# Patient Record
Sex: Female | Born: 1950 | Race: White | Hispanic: No | Marital: Married | Smoking: Former smoker
Health system: Southern US, Community
[De-identification: ages and names within clinical notes are randomized; demographics above are authoritative.]

## PROBLEM LIST (undated history)

## (undated) DIAGNOSIS — R05 Cough: Secondary | ICD-10-CM

## (undated) DIAGNOSIS — R112 Nausea with vomiting, unspecified: Secondary | ICD-10-CM

## (undated) DIAGNOSIS — M1990 Unspecified osteoarthritis, unspecified site: Secondary | ICD-10-CM

## (undated) DIAGNOSIS — D649 Anemia, unspecified: Secondary | ICD-10-CM

## (undated) DIAGNOSIS — J45909 Unspecified asthma, uncomplicated: Secondary | ICD-10-CM

## (undated) DIAGNOSIS — J302 Other seasonal allergic rhinitis: Secondary | ICD-10-CM

## (undated) DIAGNOSIS — K219 Gastro-esophageal reflux disease without esophagitis: Secondary | ICD-10-CM

## (undated) DIAGNOSIS — R600 Localized edema: Secondary | ICD-10-CM

## (undated) DIAGNOSIS — Z9889 Other specified postprocedural states: Secondary | ICD-10-CM

## (undated) HISTORY — PX: TONSILLECTOMY: SUR1361

## (undated) HISTORY — PX: SHOULDER ARTHROSCOPY WITH ROTATOR CUFF REPAIR: SHX5685

## (undated) HISTORY — PX: KNEE ARTHROSCOPY: SUR90

## (undated) HISTORY — PX: APPENDECTOMY: SHX54

## (undated) HISTORY — PX: BREAST SURGERY: SHX581

---

## 1998-05-03 ENCOUNTER — Other Ambulatory Visit: Admission: RE | Admit: 1998-05-03 | Discharge: 1998-05-03 | Payer: Self-pay | Admitting: Obstetrics and Gynecology

## 1999-03-03 ENCOUNTER — Other Ambulatory Visit: Admission: RE | Admit: 1999-03-03 | Discharge: 1999-03-03 | Payer: Self-pay | Admitting: Obstetrics and Gynecology

## 1999-10-18 ENCOUNTER — Other Ambulatory Visit: Admission: RE | Admit: 1999-10-18 | Discharge: 1999-10-18 | Payer: Self-pay | Admitting: Obstetrics and Gynecology

## 1999-11-22 ENCOUNTER — Encounter: Payer: Self-pay | Admitting: Obstetrics and Gynecology

## 1999-11-22 ENCOUNTER — Encounter: Admission: RE | Admit: 1999-11-22 | Discharge: 1999-11-22 | Payer: Self-pay | Admitting: Obstetrics and Gynecology

## 2000-11-28 ENCOUNTER — Other Ambulatory Visit: Admission: RE | Admit: 2000-11-28 | Discharge: 2000-11-28 | Payer: Self-pay | Admitting: Obstetrics and Gynecology

## 2001-01-08 ENCOUNTER — Encounter: Payer: Self-pay | Admitting: Obstetrics and Gynecology

## 2001-01-08 ENCOUNTER — Encounter: Admission: RE | Admit: 2001-01-08 | Discharge: 2001-01-08 | Payer: Self-pay | Admitting: Obstetrics and Gynecology

## 2001-09-13 ENCOUNTER — Encounter: Admission: RE | Admit: 2001-09-13 | Discharge: 2001-09-13 | Payer: Self-pay | Admitting: Obstetrics and Gynecology

## 2001-09-13 ENCOUNTER — Encounter: Payer: Self-pay | Admitting: Obstetrics and Gynecology

## 2002-01-07 ENCOUNTER — Other Ambulatory Visit: Admission: RE | Admit: 2002-01-07 | Discharge: 2002-01-07 | Payer: Self-pay | Admitting: Obstetrics and Gynecology

## 2002-03-14 ENCOUNTER — Encounter: Payer: Self-pay | Admitting: Obstetrics and Gynecology

## 2002-03-14 ENCOUNTER — Encounter: Admission: RE | Admit: 2002-03-14 | Discharge: 2002-03-14 | Payer: Self-pay | Admitting: Obstetrics and Gynecology

## 2002-07-15 ENCOUNTER — Ambulatory Visit (HOSPITAL_COMMUNITY): Admission: RE | Admit: 2002-07-15 | Discharge: 2002-07-15 | Payer: Self-pay | Admitting: Family Medicine

## 2002-07-15 ENCOUNTER — Encounter: Payer: Self-pay | Admitting: Family Medicine

## 2003-04-15 ENCOUNTER — Encounter: Payer: Self-pay | Admitting: Obstetrics and Gynecology

## 2003-04-15 ENCOUNTER — Encounter: Admission: RE | Admit: 2003-04-15 | Discharge: 2003-04-15 | Payer: Self-pay | Admitting: Obstetrics and Gynecology

## 2003-07-22 ENCOUNTER — Other Ambulatory Visit: Admission: RE | Admit: 2003-07-22 | Discharge: 2003-07-22 | Payer: Self-pay | Admitting: Obstetrics and Gynecology

## 2003-12-15 ENCOUNTER — Encounter: Admission: RE | Admit: 2003-12-15 | Discharge: 2003-12-15 | Payer: Self-pay | Admitting: Family Medicine

## 2004-05-05 ENCOUNTER — Ambulatory Visit (HOSPITAL_COMMUNITY): Admission: RE | Admit: 2004-05-05 | Discharge: 2004-05-05 | Payer: Self-pay | Admitting: Family Medicine

## 2004-06-23 ENCOUNTER — Ambulatory Visit (HOSPITAL_COMMUNITY): Admission: RE | Admit: 2004-06-23 | Discharge: 2004-06-23 | Payer: Self-pay | Admitting: Obstetrics and Gynecology

## 2004-09-28 ENCOUNTER — Other Ambulatory Visit: Admission: RE | Admit: 2004-09-28 | Discharge: 2004-09-28 | Payer: Self-pay | Admitting: Obstetrics and Gynecology

## 2005-08-16 ENCOUNTER — Ambulatory Visit (HOSPITAL_COMMUNITY): Admission: RE | Admit: 2005-08-16 | Discharge: 2005-08-16 | Payer: Self-pay | Admitting: Obstetrics and Gynecology

## 2005-11-10 ENCOUNTER — Other Ambulatory Visit: Admission: RE | Admit: 2005-11-10 | Discharge: 2005-11-10 | Payer: Self-pay | Admitting: Obstetrics and Gynecology

## 2006-10-02 ENCOUNTER — Ambulatory Visit (HOSPITAL_COMMUNITY): Admission: RE | Admit: 2006-10-02 | Discharge: 2006-10-02 | Payer: Self-pay | Admitting: Obstetrics and Gynecology

## 2007-11-20 ENCOUNTER — Ambulatory Visit (HOSPITAL_COMMUNITY): Admission: RE | Admit: 2007-11-20 | Discharge: 2007-11-20 | Payer: Self-pay | Admitting: Obstetrics and Gynecology

## 2008-12-25 ENCOUNTER — Ambulatory Visit (HOSPITAL_COMMUNITY): Admission: RE | Admit: 2008-12-25 | Discharge: 2008-12-25 | Payer: Self-pay | Admitting: Obstetrics and Gynecology

## 2010-02-24 ENCOUNTER — Ambulatory Visit (HOSPITAL_COMMUNITY): Admission: RE | Admit: 2010-02-24 | Discharge: 2010-02-24 | Payer: Self-pay | Admitting: Obstetrics and Gynecology

## 2011-03-21 ENCOUNTER — Other Ambulatory Visit (HOSPITAL_COMMUNITY): Payer: Self-pay | Admitting: Obstetrics and Gynecology

## 2011-03-21 DIAGNOSIS — Z1231 Encounter for screening mammogram for malignant neoplasm of breast: Secondary | ICD-10-CM

## 2011-03-29 ENCOUNTER — Ambulatory Visit (HOSPITAL_COMMUNITY)
Admission: RE | Admit: 2011-03-29 | Discharge: 2011-03-29 | Disposition: A | Discharge status: Home / Self Care | Payer: 59 | Source: Ambulatory Visit | Attending: Obstetrics and Gynecology | Admitting: Obstetrics and Gynecology

## 2011-03-29 DIAGNOSIS — Z1231 Encounter for screening mammogram for malignant neoplasm of breast: Secondary | ICD-10-CM

## 2012-04-23 ENCOUNTER — Other Ambulatory Visit (HOSPITAL_COMMUNITY): Payer: Self-pay | Admitting: Obstetrics and Gynecology

## 2012-04-23 DIAGNOSIS — Z1231 Encounter for screening mammogram for malignant neoplasm of breast: Secondary | ICD-10-CM

## 2012-06-03 ENCOUNTER — Ambulatory Visit (HOSPITAL_COMMUNITY)
Admission: RE | Admit: 2012-06-03 | Discharge: 2012-06-03 | Disposition: A | Discharge status: Home / Self Care | Payer: 59 | Source: Ambulatory Visit | Attending: Obstetrics and Gynecology | Admitting: Obstetrics and Gynecology

## 2012-06-03 DIAGNOSIS — Z1231 Encounter for screening mammogram for malignant neoplasm of breast: Secondary | ICD-10-CM | POA: Insufficient documentation

## 2013-07-29 ENCOUNTER — Other Ambulatory Visit (HOSPITAL_COMMUNITY): Payer: Self-pay | Admitting: Obstetrics and Gynecology

## 2013-07-29 DIAGNOSIS — Z1231 Encounter for screening mammogram for malignant neoplasm of breast: Secondary | ICD-10-CM

## 2013-08-11 DIAGNOSIS — J302 Other seasonal allergic rhinitis: Secondary | ICD-10-CM

## 2013-08-11 HISTORY — DX: Other seasonal allergic rhinitis: J30.2

## 2013-08-13 ENCOUNTER — Ambulatory Visit (HOSPITAL_COMMUNITY): Payer: 59

## 2013-08-21 ENCOUNTER — Other Ambulatory Visit: Payer: Self-pay | Admitting: Orthopedic Surgery

## 2013-08-26 ENCOUNTER — Encounter (HOSPITAL_COMMUNITY): Payer: Self-pay | Admitting: Pharmacy Technician

## 2013-08-26 ENCOUNTER — Other Ambulatory Visit: Payer: Self-pay | Admitting: Orthopedic Surgery

## 2013-08-28 ENCOUNTER — Other Ambulatory Visit (HOSPITAL_COMMUNITY): Payer: Self-pay | Admitting: Orthopedic Surgery

## 2013-08-28 NOTE — Patient Instructions (Addendum)
20 SHANTAY SONN  08/28/2013   Your procedure is scheduled on:  09/08/13  MONDAY  Report to [facility] at  0730     AM.  Call this number if you have problems the morning of surgery: [phone]       Remember:   Do not eat food  Or drink :After Midnight. Sunday NIGHT   Take these medicines the morning of surgery with A SIP OF WATER: Dexiliant        May take Alprazolam, or Allegra  if needed   .  Contacts, dentures or partial plates can not be worn to surgery  Leave suitcase in the car. After surgery it may be brought to your room.  For patients admitted to the hospital, checkout time is 11:00 AM day of  discharge.             SPECIAL INSTRUCTIONS- SEE [facility] PREPARING FOR SURGERY INSTRUCTION SHEET-     DO NOT WEAR JEWELRY, LOTIONS, POWDERS, OR PERFUMES.  WOMEN-- DO NOT SHAVE LEGS OR UNDERARMS FOR 12 HOURS BEFORE SHOWERS. MEN MAY SHAVE FACE.  Patients discharged the day of surgery will not be allowed to drive home. IF going home the day of surgery, you must have a driver and someone to stay with you for the first 24 hours  Name and phone number of your driver:  ADMISSION                                                                      Please read over the following fact sheets that you were given: MRSA Information, Incentive Spirometry Sheet, Blood Transfusion Sheet  Information                                                                                   Aireonna Bauer  PST 336  [phone]                 FAILURE TO FOLLOW THESE INSTRUCTIONS MAY RESULT IN  CANCELLATION   OF YOUR SURGERY                                                  Patient Signature _____________________________

## 2013-08-29 ENCOUNTER — Ambulatory Visit (HOSPITAL_COMMUNITY)
Admission: RE | Admit: 2013-08-29 | Discharge: 2013-08-29 | Disposition: A | Discharge status: Home / Self Care | Payer: 59 | Source: Ambulatory Visit | Attending: Orthopedic Surgery | Admitting: Orthopedic Surgery

## 2013-08-29 ENCOUNTER — Encounter (HOSPITAL_COMMUNITY)
Admission: RE | Admit: 2013-08-29 | Discharge: 2013-08-29 | Disposition: A | Discharge status: Home / Self Care | Payer: 59 | Source: Ambulatory Visit | Attending: Orthopedic Surgery | Admitting: Orthopedic Surgery

## 2013-08-29 ENCOUNTER — Encounter (HOSPITAL_COMMUNITY): Payer: Self-pay

## 2013-08-29 DIAGNOSIS — Z01812 Encounter for preprocedural laboratory examination: Secondary | ICD-10-CM | POA: Insufficient documentation

## 2013-08-29 DIAGNOSIS — M1990 Unspecified osteoarthritis, unspecified site: Secondary | ICD-10-CM | POA: Insufficient documentation

## 2013-08-29 DIAGNOSIS — R05 Cough: Secondary | ICD-10-CM | POA: Insufficient documentation

## 2013-08-29 DIAGNOSIS — Z01818 Encounter for other preprocedural examination: Secondary | ICD-10-CM | POA: Insufficient documentation

## 2013-08-29 DIAGNOSIS — R059 Cough, unspecified: Secondary | ICD-10-CM | POA: Insufficient documentation

## 2013-08-29 HISTORY — DX: Unspecified osteoarthritis, unspecified site: M19.90

## 2013-08-29 HISTORY — DX: Localized edema: R60.0

## 2013-08-29 HISTORY — DX: Cough: R05

## 2013-08-29 HISTORY — DX: Unspecified asthma, uncomplicated: J45.909

## 2013-08-29 HISTORY — DX: Other specified postprocedural states: Z98.890

## 2013-08-29 HISTORY — DX: Nausea with vomiting, unspecified: R11.2

## 2013-08-29 HISTORY — DX: Gastro-esophageal reflux disease without esophagitis: K21.9

## 2013-08-29 HISTORY — DX: Other seasonal allergic rhinitis: J30.2

## 2013-08-29 HISTORY — DX: Anemia, unspecified: D64.9

## 2013-08-29 HISTORY — DX: Cough, unspecified: R05.9

## 2013-08-29 LAB — COMPREHENSIVE METABOLIC PANEL
ALT: 27 U/L (ref 0–35)
AST: 27 U/L (ref 0–37)
Albumin: 3.8 g/dL (ref 3.5–5.2)
CO2: 26 mEq/L (ref 19–32)
Chloride: 98 mEq/L (ref 96–112)
GFR calc non Af Amer: >90 mL/min (ref >90)
Potassium: 4 mEq/L (ref 3.5–5.1)
Sodium: 136 mEq/L (ref 135–145)
Total Bilirubin: 0.5 mg/dL (ref 0.3–1.2)

## 2013-08-29 LAB — URINALYSIS, ROUTINE W REFLEX MICROSCOPIC
Bilirubin Urine: NEGATIVE
Glucose, UA: NEGATIVE mg/dL
Ketones, ur: NEGATIVE mg/dL
Leukocytes, UA: NEGATIVE
Specific Gravity, Urine: 1.008 (ref 1.005–1.030)
pH: 7.5 (ref 5.0–8.0)

## 2013-08-29 LAB — CBC
Platelets: 211 10*3/uL (ref 150–400)
RBC: 4.51 MIL/uL (ref 3.87–5.11)
WBC: 8.1 10*3/uL (ref 4.0–10.5)

## 2013-08-29 LAB — APTT: aPTT: 26 seconds (ref 24–37)

## 2013-08-29 LAB — SURGICAL PCR SCREEN: Staphylococcus aureus: NEGATIVE

## 2013-09-07 ENCOUNTER — Other Ambulatory Visit: Payer: Self-pay | Admitting: Orthopedic Surgery

## 2013-09-07 NOTE — H&P (Signed)
Christina Baker. Christina Baker  DOB: [birth year] Married / Language: English / Race: White Female  Date of Admission:  09/08/2013  Chief Complaint:  Right Knee Pain  History of Present Illness The patient is a 62 year old female who comes in for a preoperative History and Physical. The patient is scheduled for a right total knee arthroplasty to be performed by Dr. Gus Rankin. Aluisio, MD at [facility] on 09/08/2013. The patient is a 62 year old female who presents for follow up of their knee. The patient is being followed for their right knee pain and osteoarthritis. They are now 10 week(s) out from the last cortisone injection. Symptoms reported today include: pain and grinding. The patient feels that they are doing poorly (Patient states that she has continued to have pain after the injection. She would like to know if there is any other option besides a replacement.). Note for "Follow-up Knee": French Ana did her injection when she was seeing Dr. Rennis Chris for her shoulder. She states that the right knee is getting progressively worse over time. This hurts with most activities including some pain at rest now. This is definitely limiting what she can and can not do. The last cortisone injection provided some short term benefit but the knee is getting worse now. She is not having any hip pain or back pain with this. She is ready to proceed with knee surgery at this time. They have been treated conservatively in the past for the above stated problem and despite conservative measures, they continue to have progressive pain and severe functional limitations and dysfunction. They have failed non-operative management including home exercise, medications, and injections. It is felt that they would benefit from undergoing total joint replacement. Risks and benefits of the procedure have been discussed with the patient and they elect to proceed with surgery. There are no active contraindications to surgery such  as ongoing infection or rapidly progressive neurological disease.  Problem List Osteoarthritis, Knee (715.96)  Allergies Daypro *ANALGESICS - ANTI-INFLAMMATORY*. Trouble Breathing Dilaudid *ANALGESICS - OPIOID*. Hives.   Family History Congestive Heart Failure. father Osteoarthritis. mother Hypertension. mother, father and sister Heart disease in female family member before age 55 Cerebrovascular Accident. mother and grandmother mothers side Cancer. father and grandmother fathers side Heart Disease. father Depression. sister   Social History Pain Contract. yes Tobacco / smoke exposure. no Current work status. retired Exercise. Exercises daily; does running / walking Copy of Drug/Alcohol Rehab (Previously). no Living situation. live with spouse Drug/Alcohol Rehab (Currently). no Number of flights of stairs before winded. greater than 5 Alcohol use. current drinker; drinks wine; less than 5 per week Illicit drug use. no Children. 2 Marital status. married Tobacco use. former smoker; smoke(d) less than 1/2 pack(s) per day Post-Surgical Plans. Home   Medication History Maxzide-25 (37.5-25MG  Tablet, Oral) Active. Dexilant (30MG  Capsule DR, Oral) Active. Vitamin D (2000UNIT Capsule, 1 (one) Oral) Active. Advil (200MG  Capsule, Oral) Active.   Past Surgical History Appendectomy Rotator Cuff Repair. right Breast Biopsy. right Dilation and Curettage of Uterus Arthroscopy of Knee. right Arthroscopy of Shoulder. right Tonsillectomy   Medical history Asthma Gastroesophageal Reflux Disease Hypercholesterolemia. 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 05/15/2003 Pain in joint, shoulder (719.41). 05/22/2003 Sprain/strain, neck (847.0). 08/14/2003 Sprain/strain, rotator cuff (840.4). 02/26/2004 Tear, medial meniscus, knee, current (836.0). 03/03/2004 Synovitis NEC (727.09).  03/27/2005 Sprain/strain, foot NOS (845.10). 08/09/2007 Sprain/strain, knee, cruciate ligament (844.2). 08/16/2007 Derangement, meniscus NEC (717.5). 09/30/2007 Cervicalgia (723.1). 03/19/2009 Osteoarthrosis NOS, ankle/foot (715.97). 03/27/2011  Review of Systems General:Present- Night Sweats (not new, chronic in nature). Not Present- Chills, Fever, Fatigue, Weight Gain, Weight Loss and Memory Loss. Skin:Not Present- Hives, Itching, Rash, Eczema and Lesions. HEENT:Not Present- Tinnitus, Headache, Double Vision, Visual Loss, Hearing Loss and Dentures. Respiratory:Present- Shortness of breath with exertion and Wheezing (recent cold). Not Present- Shortness of breath at rest, Allergies, Coughing up blood and Chronic Cough. Cardiovascular:Not Present- Chest Pain, Racing/skipping heartbeats, Difficulty Breathing Lying Down, Murmur, Swelling and Palpitations. Gastrointestinal:Present- Heartburn. Not Present- Bloody Stool, Abdominal Pain, Vomiting, Nausea, Constipation, Diarrhea, Difficulty Swallowing, Jaundice and Loss of appetitie. Female Genitourinary:Not Present- Blood in Urine, Urinary frequency, Weak urinary stream, Discharge, Flank Pain, Incontinence, Painful Urination, Urgency, Urinary Retention and Urinating at Night. Musculoskeletal:Present- Muscle Pain, Joint Pain, Back Pain and Morning Stiffness. Not Present- Muscle Weakness, Joint Swelling and Spasms. Neurological:Not Present- Tremor, Dizziness, Blackout spells, Paralysis, Difficulty with balance and Weakness. Psychiatric:Not Present- Insomnia.   Vitals Weight: 185 lb Height: 61 in Weight was reported by patient. Height was reported by patient. Body Surface Area: 1.9 m Body Mass Index: 34.96 kg/m Pulse: 76 (Regular) Resp.: 12 (Unlabored) BP: 124/70 (Sitting, Right Arm, Standard)   Physical Exam The physical exam findings are as follows:   General Mental Status - Alert, cooperative and good  historian. General Appearance- pleasant. Not in acute distress. Orientation- Oriented X3. Build & Nutrition- Well nourished and Well developed.   Head and Neck Head- normocephalic, atraumatic . Neck Global Assessment- supple. no bruit auscultated on the right and no bruit auscultated on the left.   Eye Vision- Wears corrective lenses. Pupil- Bilateral- Regular and Round. Motion- Bilateral- EOMI.   Chest and Lung Exam Auscultation: Breath sounds:- clear at anterior chest wall and - clear at posterior chest wall. Adventitious sounds:- No Adventitious sounds.   Cardiovascular Auscultation:Rhythm- Regular rate and rhythm. Heart Sounds- S1 WNL and S2 WNL. Murmurs & Other Heart Sounds:Auscultation of the heart reveals - No Murmurs.   Abdomen Inspection:Contour- Generalized mild distention. Palpation/Percussion:Tenderness- Abdomen is non-tender to palpation. Rigidity (guarding)- Abdomen is soft. Auscultation:Auscultation of the abdomen reveals - Bowel sounds normal.   Female Genitourinary Not done, not pertinent to present illness  Musculoskeletal She is alert and oriented. No apparent distress. Her hips show a normal range of motion. No discomfort. The left knee shows no effusion. Range is about 0-135 with a slight crepitus on range of motion. No medial or lateral joint line tenderness. No instability. Her right knee shows no effusion. Range is about 5-125. There is moderate crepitus on range of motion. She is tender in the medial joint line. No lateral tenderness or instability noted. Pulse, sensation and motor are intact. Gait pattern is antalgic on the right.  RADIOGRAPHS: AP of both knees and lateral of the right from last fall show she does have bone on bone arthritis at the joint margin on the medial side of the joint. She also has significant patellofemoral arthritis.  Assessment & Plan Primary osteoarthritis of one knee (715.16) Impression:  Right Knee  Note: Plan is for a Right Total Knee Replacement by Dr. Lequita Halt.  Plan is to go home.  PCP - Dr. Durwin Nora - Patient has been seen preoperatively and felt to be stable for surgery.  Please note that the patinet states that she get sick and nauseated with anesthsia.  The patient does not have any contraindications and will receive TXA (tranexamic acid) prior to surgery.  Signed electronically by Lauraine Rinne, III PA-C

## 2013-09-08 ENCOUNTER — Encounter (HOSPITAL_COMMUNITY)
Admission: RE | Disposition: A | Discharge status: Home Health | Payer: Self-pay | Source: Ambulatory Visit | Attending: Orthopedic Surgery

## 2013-09-08 ENCOUNTER — Inpatient Hospital Stay (HOSPITAL_COMMUNITY)
Admission: RE | Admit: 2013-09-08 | Discharge: 2013-09-10 | DRG: 470 | Disposition: A | Discharge status: Home Health | Payer: 59 | Source: Ambulatory Visit | Attending: Orthopedic Surgery | Admitting: Orthopedic Surgery

## 2013-09-08 ENCOUNTER — Encounter (HOSPITAL_COMMUNITY): Payer: Self-pay | Admitting: *Deleted

## 2013-09-08 ENCOUNTER — Inpatient Hospital Stay (HOSPITAL_COMMUNITY): Payer: 59 | Admitting: Anesthesiology

## 2013-09-08 ENCOUNTER — Encounter (HOSPITAL_COMMUNITY): Payer: Self-pay | Admitting: Anesthesiology

## 2013-09-08 DIAGNOSIS — Z87891 Personal history of nicotine dependence: Secondary | ICD-10-CM

## 2013-09-08 DIAGNOSIS — Z6835 Body mass index (BMI) 35.0-35.9, adult: Secondary | ICD-10-CM

## 2013-09-08 DIAGNOSIS — Z96651 Presence of right artificial knee joint: Secondary | ICD-10-CM

## 2013-09-08 DIAGNOSIS — E876 Hypokalemia: Secondary | ICD-10-CM | POA: Diagnosis not present

## 2013-09-08 DIAGNOSIS — M1710 Unilateral primary osteoarthritis, unspecified knee: Principal | ICD-10-CM | POA: Diagnosis present

## 2013-09-08 DIAGNOSIS — D62 Acute posthemorrhagic anemia: Secondary | ICD-10-CM | POA: Diagnosis not present

## 2013-09-08 HISTORY — PX: TOTAL KNEE ARTHROPLASTY: SHX125

## 2013-09-08 LAB — TYPE AND SCREEN
ABO/RH(D): B NEG
Antibody Screen: NEGATIVE

## 2013-09-08 SURGERY — ARTHROPLASTY, KNEE, TOTAL
Anesthesia: General | Site: Knee | Laterality: Right | Wound class: Clean

## 2013-09-08 MED ORDER — DEXAMETHASONE 6 MG PO TABS
10.0000 mg | ORAL_TABLET | Freq: Every day | ORAL | Status: AC
  Administered 2013-09-09: 10 mg via ORAL
  Filled 2013-09-08: qty 1

## 2013-09-08 MED ORDER — METOCLOPRAMIDE HCL 10 MG PO TABS: 5.0000 mg | ORAL_TABLET | Freq: Three times a day (TID) | ORAL | Status: DC | PRN

## 2013-09-08 MED ORDER — MORPHINE SULFATE 2 MG/ML IJ SOLN
INTRAMUSCULAR | Status: AC
  Administered 2013-09-08: 14:00:00 2 mg via INTRAVENOUS
  Filled 2013-09-08: qty 1

## 2013-09-08 MED ORDER — FENTANYL CITRATE 0.05 MG/ML IJ SOLN
INTRAMUSCULAR | Status: DC | PRN
  Administered 2013-09-08 (×3): 100 ug via INTRAVENOUS
  Administered 2013-09-08 (×3): 50 ug via INTRAVENOUS

## 2013-09-08 MED ORDER — 0.9 % SODIUM CHLORIDE (POUR BTL) OPTIME
TOPICAL | Status: DC | PRN
  Administered 2013-09-08: 1000 mL

## 2013-09-08 MED ORDER — MORPHINE SULFATE 2 MG/ML IJ SOLN: 1.0000 mg | INTRAMUSCULAR | Status: DC | PRN

## 2013-09-08 MED ORDER — CEFAZOLIN SODIUM 1-5 GM-% IV SOLN
1.0000 g | Freq: Four times a day (QID) | INTRAVENOUS | Status: AC
  Administered 2013-09-08 (×2): 1 g via INTRAVENOUS
  Filled 2013-09-08 (×2): qty 50

## 2013-09-08 MED ORDER — LACTATED RINGERS IV SOLN
INTRAVENOUS | Status: DC
  Administered 2013-09-08: 1000 mL via INTRAVENOUS

## 2013-09-08 MED ORDER — BISACODYL 10 MG RE SUPP: 10.0000 mg | Freq: Every day | RECTAL | Status: DC | PRN

## 2013-09-08 MED ORDER — SODIUM CHLORIDE 0.9 % IV SOLN: INTRAVENOUS | Status: DC

## 2013-09-08 MED ORDER — ATROPINE SULFATE 0.4 MG/ML IJ SOLN
INTRAMUSCULAR | Status: DC | PRN
  Administered 2013-09-08: 0.4 mg via INTRAVENOUS

## 2013-09-08 MED ORDER — DOCUSATE SODIUM 100 MG PO CAPS
100.0000 mg | ORAL_CAPSULE | Freq: Two times a day (BID) | ORAL | Status: DC
  Administered 2013-09-08 – 2013-09-10 (×4): 100 mg via ORAL

## 2013-09-08 MED ORDER — CEFAZOLIN SODIUM-DEXTROSE 2-3 GM-% IV SOLR
INTRAVENOUS | Status: AC
  Filled 2013-09-08: qty 50

## 2013-09-08 MED ORDER — LORATADINE 10 MG PO TABS
10.0000 mg | ORAL_TABLET | Freq: Every day | ORAL | Status: DC
  Administered 2013-09-09 – 2013-09-10 (×2): 10 mg via ORAL
  Filled 2013-09-08 (×2): qty 1

## 2013-09-08 MED ORDER — DEXAMETHASONE SODIUM PHOSPHATE 10 MG/ML IJ SOLN
10.0000 mg | Freq: Every day | INTRAMUSCULAR | Status: AC
  Filled 2013-09-08: qty 1

## 2013-09-08 MED ORDER — RIVAROXABAN 10 MG PO TABS
10.0000 mg | ORAL_TABLET | Freq: Every day | ORAL | Status: DC
  Administered 2013-09-09 – 2013-09-10 (×2): 10 mg via ORAL
  Filled 2013-09-08 (×3): qty 1

## 2013-09-08 MED ORDER — MENTHOL 3 MG MT LOZG
1.0000 | LOZENGE | OROMUCOSAL | Status: DC | PRN
  Filled 2013-09-08: qty 9

## 2013-09-08 MED ORDER — PROPOFOL 10 MG/ML IV BOLUS
INTRAVENOUS | Status: DC | PRN
  Administered 2013-09-08: 150 mg via INTRAVENOUS

## 2013-09-08 MED ORDER — MIDAZOLAM HCL 5 MG/5ML IJ SOLN
INTRAMUSCULAR | Status: DC | PRN
  Administered 2013-09-08: 2 mg via INTRAVENOUS

## 2013-09-08 MED ORDER — LIDOCAINE HCL (CARDIAC) 20 MG/ML IV SOLN
INTRAVENOUS | Status: DC | PRN
  Administered 2013-09-08: 80 mg via INTRAVENOUS

## 2013-09-08 MED ORDER — ONDANSETRON HCL 4 MG PO TABS: 4.0000 mg | ORAL_TABLET | Freq: Four times a day (QID) | ORAL | Status: DC | PRN

## 2013-09-08 MED ORDER — ONDANSETRON HCL 4 MG/2ML IJ SOLN: 4.0000 mg | Freq: Four times a day (QID) | INTRAMUSCULAR | Status: DC | PRN

## 2013-09-08 MED ORDER — MORPHINE SULFATE 10 MG/ML IJ SOLN: 2.0000 mg | INTRAMUSCULAR | Status: DC | PRN

## 2013-09-08 MED ORDER — MORPHINE SULFATE 10 MG/ML IJ SOLN
1.0000 mg | INTRAMUSCULAR | Status: DC | PRN
  Administered 2013-09-08 (×5): 2 mg via INTRAVENOUS

## 2013-09-08 MED ORDER — LACTATED RINGERS IV SOLN
INTRAVENOUS | Status: DC | PRN
  Administered 2013-09-08 (×2): via INTRAVENOUS

## 2013-09-08 MED ORDER — MORPHINE SULFATE 10 MG/ML IJ SOLN
INTRAMUSCULAR | Status: AC
  Filled 2013-09-08: qty 1

## 2013-09-08 MED ORDER — TRAMADOL HCL 50 MG PO TABS
50.0000 mg | ORAL_TABLET | Freq: Four times a day (QID) | ORAL | Status: DC | PRN
  Administered 2013-09-09: 100 mg via ORAL
  Filled 2013-09-08: qty 2

## 2013-09-08 MED ORDER — NEOSTIGMINE METHYLSULFATE 1 MG/ML IJ SOLN
INTRAMUSCULAR | Status: DC | PRN
  Administered 2013-09-08: 5 mg via INTRAVENOUS

## 2013-09-08 MED ORDER — METOCLOPRAMIDE HCL 5 MG/ML IJ SOLN: 5.0000 mg | Freq: Three times a day (TID) | INTRAMUSCULAR | Status: DC | PRN

## 2013-09-08 MED ORDER — SODIUM CHLORIDE 0.9 % IR SOLN
Status: DC | PRN
  Administered 2013-09-08: 500 mL

## 2013-09-08 MED ORDER — ONDANSETRON HCL 4 MG/2ML IJ SOLN
INTRAMUSCULAR | Status: DC | PRN
  Administered 2013-09-08: 4 mg via INTRAVENOUS

## 2013-09-08 MED ORDER — VITAMINS A & D EX OINT
TOPICAL_OINTMENT | CUTANEOUS | Status: AC
  Filled 2013-09-08: qty 5

## 2013-09-08 MED ORDER — KETAMINE HCL 50 MG/ML IJ SOLN
INTRAMUSCULAR | Status: DC | PRN
  Administered 2013-09-08 (×5): 10 mg via INTRAMUSCULAR

## 2013-09-08 MED ORDER — METHOCARBAMOL 500 MG PO TABS
500.0000 mg | ORAL_TABLET | Freq: Four times a day (QID) | ORAL | Status: DC | PRN
  Administered 2013-09-09 – 2013-09-10 (×3): 500 mg via ORAL
  Filled 2013-09-08 (×3): qty 1

## 2013-09-08 MED ORDER — ROCURONIUM BROMIDE 100 MG/10ML IV SOLN
INTRAVENOUS | Status: DC | PRN
  Administered 2013-09-08: 50 mg via INTRAVENOUS

## 2013-09-08 MED ORDER — GLYCOPYRROLATE 0.2 MG/ML IJ SOLN
INTRAMUSCULAR | Status: DC | PRN
  Administered 2013-09-08: .8 mg via INTRAVENOUS

## 2013-09-08 MED ORDER — ACETAMINOPHEN 500 MG PO TABS
1000.0000 mg | ORAL_TABLET | Freq: Four times a day (QID) | ORAL | Status: AC
  Administered 2013-09-08 – 2013-09-09 (×4): 1000 mg via ORAL
  Filled 2013-09-08 (×3): qty 2

## 2013-09-08 MED ORDER — DEXAMETHASONE SODIUM PHOSPHATE 10 MG/ML IJ SOLN
10.0000 mg | Freq: Once | INTRAMUSCULAR | Status: AC
  Administered 2013-09-08: 10 mg via INTRAVENOUS

## 2013-09-08 MED ORDER — METHOCARBAMOL 100 MG/ML IJ SOLN
500.0000 mg | Freq: Four times a day (QID) | INTRAVENOUS | Status: DC | PRN
  Administered 2013-09-08: 500 mg via INTRAVENOUS
  Filled 2013-09-08: qty 5

## 2013-09-08 MED ORDER — BUPIVACAINE HCL 0.25 % IJ SOLN
INTRAMUSCULAR | Status: DC | PRN
  Administered 2013-09-08: 20 mL

## 2013-09-08 MED ORDER — POLYETHYLENE GLYCOL 3350 17 G PO PACK: 17.0000 g | PACK | Freq: Every day | ORAL | Status: DC | PRN

## 2013-09-08 MED ORDER — CEFAZOLIN SODIUM-DEXTROSE 2-3 GM-% IV SOLR
2.0000 g | INTRAVENOUS | Status: AC
  Administered 2013-09-08: 2 g via INTRAVENOUS

## 2013-09-08 MED ORDER — SODIUM CHLORIDE 0.9 % IJ SOLN
INTRAMUSCULAR | Status: DC | PRN
  Administered 2013-09-08: 11:00:00

## 2013-09-08 MED ORDER — PHENOL 1.4 % MT LIQD
1.0000 | OROMUCOSAL | Status: DC | PRN
  Filled 2013-09-08: qty 177

## 2013-09-08 MED ORDER — DIPHENHYDRAMINE HCL 12.5 MG/5ML PO ELIX: 12.5000 mg | ORAL_SOLUTION | ORAL | Status: DC | PRN

## 2013-09-08 MED ORDER — BUPIVACAINE LIPOSOME 1.3 % IJ SUSP
20.0000 mL | Freq: Once | INTRAMUSCULAR | Status: DC
  Filled 2013-09-08: qty 20

## 2013-09-08 MED ORDER — OXYCODONE HCL 5 MG PO TABS
5.0000 mg | ORAL_TABLET | ORAL | Status: DC | PRN
  Administered 2013-09-08 – 2013-09-10 (×12): 10 mg via ORAL
  Filled 2013-09-08 (×12): qty 2

## 2013-09-08 MED ORDER — BUPIVACAINE HCL (PF) 0.25 % IJ SOLN
INTRAMUSCULAR | Status: AC
  Filled 2013-09-08: qty 30

## 2013-09-08 MED ORDER — TRANEXAMIC ACID 100 MG/ML IV SOLN
1000.0000 mg | INTRAVENOUS | Status: AC
  Administered 2013-09-08: 1000 mg via INTRAVENOUS
  Filled 2013-09-08: qty 10

## 2013-09-08 MED ORDER — FLEET ENEMA 7-19 GM/118ML RE ENEM: 1.0000 | ENEMA | Freq: Once | RECTAL | Status: AC | PRN

## 2013-09-08 MED ORDER — SODIUM CHLORIDE 0.9 % IJ SOLN
INTRAMUSCULAR | Status: AC
  Filled 2013-09-08: qty 50

## 2013-09-08 MED ORDER — TRIAMTERENE-HCTZ 37.5-25 MG PO TABS
1.0000 | ORAL_TABLET | Freq: Every day | ORAL | Status: DC
  Administered 2013-09-09 – 2013-09-10 (×2): 1 via ORAL
  Filled 2013-09-08 (×3): qty 1

## 2013-09-08 MED ORDER — KCL IN DEXTROSE-NACL 20-5-0.9 MEQ/L-%-% IV SOLN
INTRAVENOUS | Status: DC
  Administered 2013-09-08 – 2013-09-09 (×2): via INTRAVENOUS
  Filled 2013-09-08 (×2): qty 1000

## 2013-09-08 MED ORDER — ACETAMINOPHEN 500 MG PO TABS
1000.0000 mg | ORAL_TABLET | Freq: Once | ORAL | Status: AC
  Administered 2013-09-08: 1000 mg via ORAL
  Filled 2013-09-08: qty 2

## 2013-09-08 MED ORDER — LORAZEPAM 0.5 MG PO TABS: 0.5000 mg | ORAL_TABLET | Freq: Two times a day (BID) | ORAL | Status: DC | PRN

## 2013-09-08 MED ORDER — PANTOPRAZOLE SODIUM 40 MG PO TBEC
80.0000 mg | DELAYED_RELEASE_TABLET | Freq: Every day | ORAL | Status: DC
  Filled 2013-09-08: qty 2

## 2013-09-08 SURGICAL SUPPLY — 54 items
BAG ZIPLOCK 12X15 (MISCELLANEOUS) ×2 IMPLANT
BANDAGE ELASTIC 6 VELCRO ST LF (GAUZE/BANDAGES/DRESSINGS) ×2 IMPLANT
BANDAGE ESMARK 6X9 LF (GAUZE/BANDAGES/DRESSINGS) ×1 IMPLANT
BLADE SAG 18X100X1.27 (BLADE) ×2 IMPLANT
BLADE SAW SGTL 11.0X1.19X90.0M (BLADE) ×2 IMPLANT
BNDG ESMARK 6X9 LF (GAUZE/BANDAGES/DRESSINGS) ×2
BOWL SMART MIX CTS (DISPOSABLE) ×2 IMPLANT
CAPT RP KNEE ×2 IMPLANT
CEMENT HV SMART SET (Cement) ×2 IMPLANT
CLOTH BEACON ORANGE TIMEOUT ST (SAFETY) ×2 IMPLANT
CUFF TOURN SGL QUICK 34 (TOURNIQUET CUFF) ×1
CUFF TRNQT CYL 34X4X40X1 (TOURNIQUET CUFF) ×1 IMPLANT
DECANTER SPIKE VIAL GLASS SM (MISCELLANEOUS) ×2 IMPLANT
DRAPE EXTREMITY T 121X128X90 (DRAPE) ×2 IMPLANT
DRAPE POUCH INSTRU U-SHP 10X18 (DRAPES) ×2 IMPLANT
DRAPE U-SHAPE 47X51 STRL (DRAPES) ×2 IMPLANT
DRSG ADAPTIC 3X8 NADH LF (GAUZE/BANDAGES/DRESSINGS) ×2 IMPLANT
DRSG PAD ABDOMINAL 8X10 ST (GAUZE/BANDAGES/DRESSINGS) ×2 IMPLANT
DURAPREP 26ML APPLICATOR (WOUND CARE) ×2 IMPLANT
ELECT REM PT RETURN 9FT ADLT (ELECTROSURGICAL) ×2
ELECTRODE REM PT RTRN 9FT ADLT (ELECTROSURGICAL) ×1 IMPLANT
EVACUATOR 1/8 PVC DRAIN (DRAIN) ×2 IMPLANT
FACESHIELD LNG OPTICON STERILE (SAFETY) ×10 IMPLANT
GLOVE BIO SURGEON STRL SZ7.5 (GLOVE) ×2 IMPLANT
GLOVE BIO SURGEON STRL SZ8 (GLOVE) ×2 IMPLANT
GLOVE BIOGEL PI IND STRL 8 (GLOVE) ×2 IMPLANT
GLOVE BIOGEL PI INDICATOR 8 (GLOVE) ×2
GOWN PREVENTION PLUS LG XLONG (DISPOSABLE) ×2 IMPLANT
GOWN STRL REIN XL XLG (GOWN DISPOSABLE) ×4 IMPLANT
HANDPIECE INTERPULSE COAX TIP (DISPOSABLE) ×1
IMMOBILIZER KNEE 20 (SOFTGOODS) ×2
IMMOBILIZER KNEE 20 THIGH 36 (SOFTGOODS) ×1 IMPLANT
KIT BASIN OR (CUSTOM PROCEDURE TRAY) ×2 IMPLANT
MANIFOLD NEPTUNE II (INSTRUMENTS) ×2 IMPLANT
NDL SAFETY ECLIPSE 18X1.5 (NEEDLE) ×2 IMPLANT
NEEDLE HYPO 18GX1.5 SHARP (NEEDLE) ×2
NS IRRIG 1000ML POUR BTL (IV SOLUTION) ×2 IMPLANT
PACK TOTAL JOINT (CUSTOM PROCEDURE TRAY) ×2 IMPLANT
PADDING CAST COTTON 6X4 STRL (CAST SUPPLIES) ×2 IMPLANT
POSITIONER SURGICAL ARM (MISCELLANEOUS) ×2 IMPLANT
SET HNDPC FAN SPRY TIP SCT (DISPOSABLE) ×1 IMPLANT
SPONGE GAUZE 4X4 12PLY (GAUZE/BANDAGES/DRESSINGS) ×2 IMPLANT
STRIP CLOSURE SKIN 1/2X4 (GAUZE/BANDAGES/DRESSINGS) ×2 IMPLANT
SUCTION FRAZIER 12FR DISP (SUCTIONS) ×2 IMPLANT
SUT MNCRL AB 4-0 PS2 18 (SUTURE) ×2 IMPLANT
SUT VIC AB 2-0 CT1 27 (SUTURE) ×3
SUT VIC AB 2-0 CT1 TAPERPNT 27 (SUTURE) ×3 IMPLANT
SUT VLOC 180 0 24IN GS25 (SUTURE) ×2 IMPLANT
SYR 20CC LL (SYRINGE) ×2 IMPLANT
SYR 50ML LL SCALE MARK (SYRINGE) ×2 IMPLANT
TOWEL OR 17X26 10 PK STRL BLUE (TOWEL DISPOSABLE) ×4 IMPLANT
TRAY FOLEY CATH 14FRSI W/METER (CATHETERS) ×2 IMPLANT
WATER STERILE IRR 1500ML POUR (IV SOLUTION) ×4 IMPLANT
WRAP KNEE MAXI GEL POST OP (GAUZE/BANDAGES/DRESSINGS) ×2 IMPLANT

## 2013-09-08 NOTE — H&P (View-Only) (Signed)
Christina Baker  DOB: 09/23/1951 Married / Language: English / Race: White Female  Date of Admission:  09/08/2013  Chief Complaint:  Right Knee Pain  History of Present Illness The patient is a 62 year old female who comes in for a preoperative History and Physical. The patient is scheduled for a right total knee arthroplasty to be performed by Dr. Frank V. Aluisio, MD at [facility] on 09/08/2013. The patient is a 62 year old female who presents for follow up of their knee. The patient is being followed for their right knee pain and osteoarthritis. They are now 10 week(s) out from the last cortisone injection. Symptoms reported today include: pain and grinding. The patient feels that they are doing poorly (Patient states that she has continued to have pain after the injection. She would like to know if there is any other option besides a replacement.). Note for "Follow-up Knee": Tracy did her injection when she was seeing Dr. Supple for her shoulder. She states that the right knee is getting progressively worse over time. This hurts with most activities including some pain at rest now. This is definitely limiting what she can and can not do. The last cortisone injection provided some short term benefit but the knee is getting worse now. She is not having any hip pain or back pain with this. She is ready to proceed with knee surgery at this time. They have been treated conservatively in the past for the above stated problem and despite conservative measures, they continue to have progressive pain and severe functional limitations and dysfunction. They have failed non-operative management including home exercise, medications, and injections. It is felt that they would benefit from undergoing total joint replacement. Risks and benefits of the procedure have been discussed with the patient and they elect to proceed with surgery. There are no active contraindications to surgery such  as ongoing infection or rapidly progressive neurological disease.  Problem List Osteoarthritis, Knee (715.96)  Allergies Daypro *ANALGESICS - ANTI-INFLAMMATORY*. Trouble Breathing Dilaudid *ANALGESICS - OPIOID*. Hives.   Family History Congestive Heart Failure. father Osteoarthritis. mother Hypertension. mother, father and sister Heart disease in female family member before age 55 Cerebrovascular Accident. mother and grandmother mothers side Cancer. father and grandmother fathers side Heart Disease. father Depression. sister   Social History Pain Contract. yes Tobacco / smoke exposure. no Current work status. retired Exercise. Exercises daily; does running / walking Copy of Drug/Alcohol Rehab (Previously). no Living situation. live with spouse Drug/Alcohol Rehab (Currently). no Number of flights of stairs before winded. greater than 5 Alcohol use. current drinker; drinks wine; less than 5 per week Illicit drug use. no Children. 2 Marital status. married Tobacco use. former smoker; smoke(d) less than 1/2 pack(s) per day Post-Surgical Plans. Home   Medication History Maxzide-25 (37.5-25MG Tablet, Oral) Active. Dexilant (30MG Capsule DR, Oral) Active. Vitamin D (2000UNIT Capsule, 1 (one) Oral) Active. Advil (200MG Capsule, Oral) Active.   Past Surgical History Appendectomy Rotator Cuff Repair. right Breast Biopsy. right Dilation and Curettage of Uterus Arthroscopy of Knee. right Arthroscopy of Shoulder. right Tonsillectomy   Medical history Asthma Gastroesophageal Reflux Disease Hypercholesterolemia. 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 05/15/2003 Pain in joint, shoulder (719.41). 05/22/2003 Sprain/strain, neck (847.0). 08/14/2003 Sprain/strain, rotator cuff (840.4). 02/26/2004 Tear, medial meniscus, knee, current (836.0). 03/03/2004 Synovitis NEC (727.09).  03/27/2005 Sprain/strain, foot NOS (845.10). 08/09/2007 Sprain/strain, knee, cruciate ligament (844.2). 08/16/2007 Derangement, meniscus NEC (717.5). 09/30/2007 Cervicalgia (723.1). 03/19/2009 Osteoarthrosis NOS, ankle/foot (715.97). 03/27/2011     Review of Systems General:Present- Night Sweats (not new, chronic in nature). Not Present- Chills, Fever, Fatigue, Weight Gain, Weight Loss and Memory Loss. Skin:Not Present- Hives, Itching, Rash, Eczema and Lesions. HEENT:Not Present- Tinnitus, Headache, Double Vision, Visual Loss, Hearing Loss and Dentures. Respiratory:Present- Shortness of breath with exertion and Wheezing (recent cold). Not Present- Shortness of breath at rest, Allergies, Coughing up blood and Chronic Cough. Cardiovascular:Not Present- Chest Pain, Racing/skipping heartbeats, Difficulty Breathing Lying Down, Murmur, Swelling and Palpitations. Gastrointestinal:Present- Heartburn. Not Present- Bloody Stool, Abdominal Pain, Vomiting, Nausea, Constipation, Diarrhea, Difficulty Swallowing, Jaundice and Loss of appetitie. Female Genitourinary:Not Present- Blood in Urine, Urinary frequency, Weak urinary stream, Discharge, Flank Pain, Incontinence, Painful Urination, Urgency, Urinary Retention and Urinating at Night. Musculoskeletal:Present- Muscle Pain, Joint Pain, Back Pain and Morning Stiffness. Not Present- Muscle Weakness, Joint Swelling and Spasms. Neurological:Not Present- Tremor, Dizziness, Blackout spells, Paralysis, Difficulty with balance and Weakness. Psychiatric:Not Present- Insomnia.   Vitals Weight: 185 lb Height: 61 in Weight was reported by patient. Height was reported by patient. Body Surface Area: 1.9 m Body Mass Index: 34.96 kg/m Pulse: 76 (Regular) Resp.: 12 (Unlabored) BP: 124/70 (Sitting, Right Arm, Standard)   Physical Exam The physical exam findings are as follows:   General Mental Status - Alert, cooperative and good  historian. General Appearance- pleasant. Not in acute distress. Orientation- Oriented X3. Build & Nutrition- Well nourished and Well developed.   Head and Neck Head- normocephalic, atraumatic . Neck Global Assessment- supple. no bruit auscultated on the right and no bruit auscultated on the left.   Eye Vision- Wears corrective lenses. Pupil- Bilateral- Regular and Round. Motion- Bilateral- EOMI.   Chest and Lung Exam Auscultation: Breath sounds:- clear at anterior chest wall and - clear at posterior chest wall. Adventitious sounds:- No Adventitious sounds.   Cardiovascular Auscultation:Rhythm- Regular rate and rhythm. Heart Sounds- S1 WNL and S2 WNL. Murmurs & Other Heart Sounds:Auscultation of the heart reveals - No Murmurs.   Abdomen Inspection:Contour- Generalized mild distention. Palpation/Percussion:Tenderness- Abdomen is non-tender to palpation. Rigidity (guarding)- Abdomen is soft. Auscultation:Auscultation of the abdomen reveals - Bowel sounds normal.   Female Genitourinary Not done, not pertinent to present illness  Musculoskeletal She is alert and oriented. No apparent distress. Her hips show a normal range of motion. No discomfort. The left knee shows no effusion. Range is about 0-135 with a slight crepitus on range of motion. No medial or lateral joint line tenderness. No instability. Her right knee shows no effusion. Range is about 5-125. There is moderate crepitus on range of motion. She is tender in the medial joint line. No lateral tenderness or instability noted. Pulse, sensation and motor are intact. Gait pattern is antalgic on the right.  RADIOGRAPHS: AP of both knees and lateral of the right from last fall show she does have bone on bone arthritis at the joint margin on the medial side of the joint. She also has significant patellofemoral arthritis.  Assessment & Plan Primary osteoarthritis of one knee (715.16) Impression:  Right Knee  Note: Plan is for a Right Total Knee Replacement by Dr. Aluisio.  Plan is to go home.  PCP - Dr. Randal Harris - Patient has been seen preoperatively and felt to be stable for surgery.  Please note that the patinet states that she get sick and nauseated with anesthsia.  The patient does not have any contraindications and will receive TXA (tranexamic acid) prior to surgery.  Signed electronically by Jerimiah Wolman L Jazier Mcglamery, III PA-C  

## 2013-09-08 NOTE — Anesthesia Preprocedure Evaluation (Addendum)
Anesthesia Evaluation  Patient identified by MRN, date of birth, ID band Patient awake    Reviewed: Allergy & Precautions, H&P , NPO status , Patient's Chart, lab work & pertinent test results  History of Anesthesia Complications (+) PONV  Airway Mallampati: II TM Distance: >3 FB Neck ROM: Full    Dental no notable dental hx.    Pulmonary asthma ,  breath sounds clear to auscultation  Pulmonary exam normal       Cardiovascular negative cardio ROS  Rhythm:Regular Rate:Normal     Neuro/Psych negative neurological ROS  negative psych ROS   GI/Hepatic Neg liver ROS, GERD-  Medicated,  Endo/Other  negative endocrine ROS  Renal/GU negative Renal ROS  negative genitourinary   Musculoskeletal negative musculoskeletal ROS (+)   Abdominal (+) + obese,   Peds negative pediatric ROS (+)  Hematology negative hematology ROS (+)   Anesthesia Other Findings   Reproductive/Obstetrics negative OB ROS                           Anesthesia Physical Anesthesia Plan  ASA: II  Anesthesia Plan: General   Post-op Pain Management:    Induction: Intravenous  Airway Management Planned: Oral ETT  Additional Equipment:   Intra-op Plan:   Post-operative Plan: Extubation in OR  Informed Consent: I have reviewed the patients History and Physical, chart, labs and discussed the procedure including the risks, benefits and alternatives for the proposed anesthesia with the patient or authorized representative who has indicated his/her understanding and acceptance.   Dental advisory given  Plan Discussed with: CRNA  Anesthesia Plan Comments: (Discussed general versus spinal. Patient strongly prefers general.)       Anesthesia Quick Evaluation

## 2013-09-08 NOTE — Progress Notes (Signed)
Orthopedic Tech Progress Note Patient Details:  Christina Baker January 22, 1951 [id number] CPM and OHF applied CPM Right Knee CPM Right Knee: On Right Knee Flexion (Degrees): 40 Right Knee Extension (Degrees): 10   Asia R Thompson 09/08/2013, 1:08 PM

## 2013-09-08 NOTE — Op Note (Signed)
Pre-operative diagnosis- Osteoarthritis  Right knee(s)  Post-operative diagnosis- Osteoarthritis Right knee(s)  Procedure-  Right  Total Knee Arthroplasty  Surgeon- Gus Rankin. Kammie Scioli, MD  Assistant- Avel Peace, PA-C   Anesthesia-  General  EBL-* No blood loss amount entered *   Drains Hemovac  Tourniquet time-  Total Tourniquet Time Documented: Thigh (Right) - 31 minutes Total: Thigh (Right) - 31 minutes    Complications- None  Condition-PACU - hemodynamically stable.   Brief Clinical Note  Christina Baker is a 62 y.o. year old female with end stage OA of her right knee with progressively worsening pain and dysfunction. She has constant pain, with activity and at rest and significant functional deficits with difficulties even with ADLs. She has had extensive non-op management including analgesics, injections of cortisone and viscosupplements, and home exercise program, but remains in significant pain with significant dysfunction.Radiographs show bone on bone arthritis medial and patellofemoral. She presents now for right Total Knee Arthroplasty.    Procedure in detail---   The patient is brought into the operating room and positioned supine on the operating table. After successful administration of  General,   a tourniquet is placed high on the  Right thigh(s) and the lower extremity is prepped and draped in the usual sterile fashion. Time out is performed by the operating team and then the  Right lower extremity is wrapped in Esmarch, knee flexed and the tourniquet inflated to 300 mmHg.       A midline incision is made with a ten blade through the subcutaneous tissue to the level of the extensor mechanism. A fresh blade is used to make a medial parapatellar arthrotomy. Soft tissue over the proximal medial tibia is subperiosteally elevated to the joint line with a knife and into the semimembranosus bursa with a Cobb elevator. Soft tissue over the proximal lateral tibia is elevated  with attention being paid to avoiding the patellar tendon on the tibial tubercle. The patella is everted, knee flexed 90 degrees and the ACL and PCL are removed. Findings are bone on bone medial and patellofemoral with large medial osteophytes.        The drill is used to create a starting hole in the distal femur and the canal is thoroughly irrigated with sterile saline to remove the fatty contents. The 5 degree Right  valgus alignment guide is placed into the femoral canal and the distal femoral cutting block is pinned to remove 10 mm off the distal femur. Resection is made with an oscillating saw.      The tibia is subluxed forward and the menisci are removed. The extramedullary alignment guide is placed referencing proximally at the medial aspect of the tibial tubercle and distally along the second metatarsal axis and tibial crest. The block is pinned to remove 2mm off the more deficient medial  side. Resection is made with an oscillating saw. Size 2is the most appropriate size for the tibia and the proximal tibia is prepared with the modular drill and keel punch for that size.      The femoral sizing guide is placed and size 2 is most appropriate. Rotation is marked off the epicondylar axis and confirmed by creating a rectangular flexion gap at 90 degrees. The size 2 cutting block is pinned in this rotation and the anterior, posterior and chamfer cuts are made with the oscillating saw. The intercondylar block is then placed and that cut is made.      Trial size 2 tibial component, trial size  2 posterior stabilized femur and a 12.5  mm posterior stabilized rotating platform insert trial is placed. Full extension is achieved with excellent varus/valgus and anterior/posterior balance throughout full range of motion. The patella is everted and thickness measured to be 21  mm. Free hand resection is taken to 12 mm, a 35 template is placed, lug holes are drilled, trial patella is placed, and it tracks normally.  Osteophytes are removed off the posterior femur with the trial in place. All trials are removed and the cut bone surfaces prepared with pulsatile lavage. Cement is mixed and once ready for implantation, the size 2 tibial implant, size  2 posterior stabilized femoral component, and the size 35 patella are cemented in place and the patella is held with the clamp. The trial insert is placed and the knee held in full extension. The Exparel (20 ml mixed with 30 ml saline) and .25% Bupivicaine, are injected into the extensor mechanism, posterior capsule, medial and lateral gutters and subcutaneous tissues.  All extruded cement is removed and once the cement is hard the permanent 12.5 mm posterior stabilized rotating platform insert is placed into the tibial tray.      The wound is copiously irrigated with saline solution and the extensor mechanism closed over a hemovac drain with #1 PDS suture. The tourniquet is released for a total tourniquet time of 31  minutes. Flexion against gravity is 140 degrees and the patella tracks normally. Subcutaneous tissue is closed with 2.0 vicryl and subcuticular with running 4.0 Monocryl. The incision is cleaned and dried and steri-strips and a bulky sterile dressing are applied. The limb is placed into a knee immobilizer and the patient is awakened and transported to recovery in stable condition.      Please note that a surgical assistant was a medical necessity for this procedure in order to perform it in a safe and expeditious manner. Surgical assistant was necessary to retract the ligaments and vital neurovascular structures to prevent injury to them and also necessary for proper positioning of the limb to allow for anatomic placement of the prosthesis.   Gus Rankin Tage Feggins, MD    09/08/2013, 11:27 AM

## 2013-09-08 NOTE — Interval H&P Note (Signed)
History and Physical Interval Note:  09/08/2013 9:41 AM  Christina Baker  has presented today for surgery, with the diagnosis of osteoarthritis of the right knee  The various methods of treatment have been discussed with the patient and family. After consideration of risks, benefits and other options for treatment, the patient has consented to  Procedure(s): RIGHT TOTAL KNEE ARTHROPLASTY (Right) as a surgical intervention .  The patient's history has been reviewed, patient examined, no change in status, stable for surgery.  I have reviewed the patient's chart and labs.  Questions were answered to the patient's satisfaction.     Loanne Drilling

## 2013-09-08 NOTE — Progress Notes (Signed)
Utilization review completed.  

## 2013-09-08 NOTE — Transfer of Care (Signed)
Immediate Anesthesia Transfer of Care Note  Patient: Christina Baker  Procedure(s) Performed: Procedure(s): RIGHT TOTAL KNEE ARTHROPLASTY (Right)  Patient Location: PACU  Anesthesia Type:General  Level of Consciousness: awake, alert , oriented and patient cooperative  Airway & Oxygen Therapy: Patient Spontanous Breathing and Patient connected to face mask oxygen  Post-op Assessment: Report given to PACU RN, Post -op Vital signs reviewed and stable and Patient moving all extremities  Post vital signs: Reviewed and stable  Complications: No apparent anesthesia complications

## 2013-09-08 NOTE — Preoperative (Signed)
Beta Blockers   Reason not to administer Beta Blockers:Not Applicable 

## 2013-09-09 ENCOUNTER — Encounter (HOSPITAL_COMMUNITY): Payer: Self-pay | Admitting: Orthopedic Surgery

## 2013-09-09 DIAGNOSIS — E876 Hypokalemia: Secondary | ICD-10-CM | POA: Diagnosis not present

## 2013-09-09 DIAGNOSIS — D62 Acute posthemorrhagic anemia: Secondary | ICD-10-CM | POA: Diagnosis not present

## 2013-09-09 LAB — CBC
Hemoglobin: 11.3 g/dL — ABNORMAL LOW (ref 12.0–15.0)
MCH: 31 pg (ref 26.0–34.0)
Platelets: 211 10*3/uL (ref 150–400)
RBC: 3.64 MIL/uL — ABNORMAL LOW (ref 3.87–5.11)
WBC: 13 10*3/uL — ABNORMAL HIGH (ref 4.0–10.5)

## 2013-09-09 LAB — BASIC METABOLIC PANEL
CO2: 23 mEq/L (ref 19–32)
Calcium: 8.4 mg/dL (ref 8.4–10.5)
Chloride: 101 mEq/L (ref 96–112)
GFR calc Af Amer: >90 mL/min (ref >90)
Glucose, Bld: 125 mg/dL — ABNORMAL HIGH (ref 70–99)
Potassium: 3.3 mEq/L — ABNORMAL LOW (ref 3.5–5.1)
Sodium: 137 mEq/L (ref 135–145)

## 2013-09-09 MED ORDER — DEXLANSOPRAZOLE 60 MG PO CPDR
60.0000 mg | DELAYED_RELEASE_CAPSULE | Freq: Every day | ORAL | Status: DC
  Filled 2013-09-09 (×2): qty 1

## 2013-09-09 MED ORDER — NON FORMULARY: 60.0000 mg | Freq: Every day | Status: DC

## 2013-09-09 NOTE — Evaluation (Signed)
Physical Therapy Evaluation Patient Details Name: Christina Baker MRN: [id number] DOB: 1951-07-20 Today's Date: 09/09/2013 Time: 4098-1191 PT Time Calculation (min): 15 min  PT Assessment / Plan / Recommendation History of Present Illness  62 yo female s/p R TKA 9/29  Clinical Impression  On eval, pt required Min-Mod assist for mobility-able to ambulate ~15 feet with RW. Limited by pain. Pt medicated just prior to session. Recommend HHPT, [place].     PT Assessment  Patient needs continued PT services    Follow Up Recommendations  Home health PT    Does the patient have the potential to tolerate intense rehabilitation      Barriers to Discharge        Equipment Recommendations  Rolling walker with 5" wheels    Recommendations for Other Services OT consult   Frequency 7X/week    Precautions / Restrictions Precautions Precautions: Knee Required Braces or Orthoses: Knee Immobilizer - Right Knee Immobilizer - Right: Discontinue once straight leg raise with < 10 degree lag Restrictions Weight Bearing Restrictions: No RLE Weight Bearing: Weight bearing as tolerated   Pertinent Vitals/Pain 8/10 R knee with activity. Ice applied end of session      Mobility  Bed Mobility Bed Mobility: Supine to Sit Supine to Sit: 4: Min assist Details for Bed Mobility Assistance: assist for r le off bed. vcs safety,hand placement Transfers Transfers: Sit to Stand;Stand to Sit Sit to Stand: 3: Mod assist;From bed Stand to Sit: 4: Min assist;To chair/3-in-1;With armrests Details for Transfer Assistance: assist to rise, stabilize, control descent. vcs safety, hand placement Ambulation/Gait Ambulation/Gait Assistance: 4: Min assist Ambulation Distance (Feet): 15 Feet Assistive device: Rolling walker Ambulation/Gait Assistance Details: vcs safety, technique, sequence. assist to stabilize throughout ambulation. pt c/o 8/10  pain. followedwith recliner.  Gait Pattern: Step-to  pattern;Decreased stride length;Antalgic;Decreased step length - right    Exercises     PT Diagnosis: Difficulty walking;Abnormality of gait;Acute pain  PT Problem List: Decreased strength;Decreased range of motion;Decreased activity tolerance;Decreased mobility;Pain;Decreased knowledge of precautions;Decreased knowledge of use of DME PT Treatment Interventions: DME instruction;Gait training;Stair training;Functional mobility training;Therapeutic activities;Therapeutic exercise;Patient/family education     PT Goals(Current goals can be found in the care plan section) Acute Rehab PT Goals Patient Stated Goal: home. regain independence PT Goal Formulation: With patient Time For Goal Achievement: 09/16/13 Potential to Achieve Goals: Good  Visit Information  Last PT Received On: 09/09/13 Assistance Needed: +1 History of Present Illness: 62 yo female s/p R TKA 9/29       Prior Functioning  Home Living Family/patient expects to be discharged to:: Private residence Living Arrangements: Spouse/significant other Type of Home: House Home Layout: Two level;Able to live on main level with bedroom/bathroom Alternate Level Stairs-Number of Steps: 2.5 steps-garage Home Equipment: Bedside commode Prior Function Level of Independence: Independent Communication Communication: No difficulties Dominant Hand: Right    Cognition  Cognition Arousal/Alertness: Awake/alert Behavior During Therapy: WFL for tasks assessed/performed Overall Cognitive Status: Within Functional Limits for tasks assessed    Extremity/Trunk Assessment Upper Extremity Assessment Upper Extremity Assessment: Defer to OT evaluation Lower Extremity Assessment Lower Extremity Assessment: RLE deficits/detail RLE Deficits / Details: hip flex 2/5, moves ankle well Cervical / Trunk Assessment Cervical / Trunk Assessment: Normal   Balance    End of Session PT - End of Session Equipment Utilized During Treatment: Gait  belt Activity Tolerance: Patient limited by pain Patient left: in chair;with call bell/phone within reach CPM Right Knee CPM Right Knee: Off  GP  Weston Anna, MPT Pager: [phone]

## 2013-09-09 NOTE — Progress Notes (Signed)
Physical Therapy Treatment Patient Details Name: Christina Baker MRN: [id number] DOB: [birth year] Today's Date: 09/09/2013 Time: 4098-1191 PT Time Calculation (min): 25 min  PT Assessment / Plan / Recommendation  History of Present Illness 62 yo female s/p R TKA 9/29   PT Comments   Progressing with mobility. Plan is possibly for d/c home on tomorrow. Will need to practice steps.   Follow Up Recommendations  Home health PT     Does the patient have the potential to tolerate intense rehabilitation     Barriers to Discharge        Equipment Recommendations  Rolling walker with 5" wheels    Recommendations for Other Services OT consult  Frequency 7X/week   Progress towards PT Goals Progress towards PT goals: Progressing toward goals  Plan Current plan remains appropriate    Precautions / Restrictions Precautions Precautions: Knee Required Braces or Orthoses: Knee Immobilizer - Right Knee Immobilizer - Right: Discontinue once straight leg raise with < 10 degree lag Restrictions Weight Bearing Restrictions: No RLE Weight Bearing: Weight bearing as tolerated   Pertinent Vitals/Pain 5/10 R knee with activity. Ice applied end of session    Mobility  Bed Mobility Bed Mobility: Sit to Supine Supine to Sit: 4: Min assist Sit to Supine: 4: Min assist Details for Bed Mobility Assistance: assist for R Le.  Transfers Transfers: Sit to Stand;Stand to Sit Sit to Stand: 4: Min guard;From chair/3-in-1 Stand to Sit: 4: Min guard;To bed Details for Transfer Assistance:  vcs safety, hand placement Ambulation/Gait Ambulation/Gait Assistance: 4: Min assist Ambulation Distance (Feet): 80 Feet Assistive device: Rolling walker Ambulation/Gait Assistance Details: assist to stabilize intermittently. VCs safey, technique, sequence Gait Pattern: Step-to pattern;Decreased stride length;Antalgic;Decreased step length - right    Exercises Total Joint Exercises Ankle Circles/Pumps:  AROM;Both;10 reps;Supine Quad Sets: AROM;Both;10 reps;Supine Heel Slides: AAROM;Right;10 reps;Supine Hip ABduction/ADduction: AAROM;Right;10 reps;Supine Straight Leg Raises: AAROM;Right;10 reps;Supine   PT Diagnosis: Difficulty walking;Abnormality of gait;Acute pain  PT Problem List: Decreased strength;Decreased range of motion;Decreased activity tolerance;Decreased mobility;Pain;Decreased knowledge of precautions;Decreased knowledge of use of DME PT Treatment Interventions: DME instruction;Gait training;Stair training;Functional mobility training;Therapeutic activities;Therapeutic exercise;Patient/family education   PT Goals (current goals can now be found in the care plan section) Acute Rehab PT Goals Patient Stated Goal: home. regain independence PT Goal Formulation: With patient Time For Goal Achievement: 09/16/13 Potential to Achieve Goals: Good  Visit Information  Last PT Received On: 09/09/13 Assistance Needed: +1 History of Present Illness: 62 yo female s/p R TKA 9/29    Subjective Data  Patient Stated Goal: home. regain independence   Cognition  Cognition Arousal/Alertness: Awake/alert Behavior During Therapy: WFL for tasks assessed/performed Overall Cognitive Status: Within Functional Limits for tasks assessed    Balance  Balance Balance Assessed: Yes Dynamic Standing Balance Dynamic Standing - Balance Support: During functional activity Dynamic Standing - Level of Assistance: 4: Min assist  End of Session PT - End of Session Equipment Utilized During Treatment: Gait belt Activity Tolerance: Patient tolerated treatment well Patient left: in bed;with call bell/phone within reach CPM Right Knee CPM Right Knee: Off   GP     Rebeca Alert, MPT Pager: [phone]

## 2013-09-09 NOTE — Anesthesia Postprocedure Evaluation (Signed)
  Anesthesia Post-op Note  Patient: Christina Baker  Procedure(s) Performed: Procedure(s) (LRB): RIGHT TOTAL KNEE ARTHROPLASTY (Right)  Patient Location: PACU  Anesthesia Type: General  Level of Consciousness: awake and alert   Airway and Oxygen Therapy: Patient Spontanous Breathing  Post-op Pain: mild  Post-op Assessment: Post-op Vital signs reviewed, Patient's Cardiovascular Status Stable, Respiratory Function Stable, Patent Airway and No signs of Nausea or vomiting  Last Vitals:  Filed Vitals:   09/09/13 1000  BP: 111/61  Pulse: 63  Temp: 36.6 C  Resp: 18    Post-op Vital Signs: stable   Complications: No apparent anesthesia complications

## 2013-09-09 NOTE — Care Management Note (Addendum)
    Page 1 of 2   09/10/2013     3:43:19 PM   CARE MANAGEMENT NOTE 09/10/2013  Patient:  Christina Baker, Christina Baker   Account Number:  [id number]  Date Initiated:  09/08/2013  Documentation initiated by:  Colleen Can  Subjective/Objective Assessment:   DX rt total knee arthroplasty on day of admission     Action/Plan:   From home  Pt states she plans to return to home in [place] [place] [facility] where spouse will be caregiver. States she has raised toilet seat but will need RW.   Anticipated DC Date:  08/12/2013   Anticipated DC Plan:  HOME W HOME HEALTH SERVICES      DC Planning Services  CM consult      [facility] Choice  HOME HEALTH   Choice offered to / List presented to:  C-1 Patient   DME arranged  3-N-1      DME agency  [facility]     HH arranged  HH-2 PT      [facility] agency  [facility]   Status of service:  Completed, signed off Medicare Important Message given?   (If response is "NO", the following Medicare IM given date fields will be blank) Date Medicare IM given:   Date Additional Medicare IM given:    Discharge Disposition:  HOME W HOME HEALTH SERVICES  Per UR Regulation:  Reviewed for med. necessity/level of care/duration of stay  If discussed at Long Length of Stay Meetings, dates discussed:    Comments:  09/10/2013 Colleen Can BSN RN CCM [phone] DISCHARGED WITH [facility] CARE IN PLACE. sERVICES WILL START TOMORROW.

## 2013-09-09 NOTE — Progress Notes (Signed)
   Subjective: 1 Day Post-Op Procedure(s) (LRB): RIGHT TOTAL KNEE ARTHROPLASTY (Right) Patient reports pain as mild.   Patient seen in rounds with Dr. Lequita Halt. Patient is well, and has had no acute complaints or problems We will start therapy today.  Plan is to go Home after hospital stay.  Objective: Vital signs in last 24 hours: Temp:  [96.7 F (35.9 C)-98.7 F (37.1 C)] 98.5 F (36.9 C) (09/30 0655) Pulse Rate:  [51-94] 76 (09/30 0655) Resp:  [11-18] 17 (09/30 0655) BP: (94-158)/(57-84) 94/57 mmHg (09/30 0655) SpO2:  [94 %-100 %] 96 % (09/30 0655) Weight:  [85.73 kg (189 lb)] 85.73 kg (189 lb) (09/29 1315)  Intake/Output from previous day:  Intake/Output Summary (Last 24 hours) at 09/09/13 0846 Last data filed at 09/09/13 0655  Gross per 24 hour  Intake 4398.75 ml  Output   1805 ml  Net 2593.75 ml    Intake/Output this shift: UOP 1100 since MN +2593  Labs:  Recent Labs  09/09/13 0405  HGB 11.3*    Recent Labs  09/09/13 0405  WBC 13.0*  RBC 3.64*  HCT 33.3*  PLT 211    Recent Labs  09/09/13 0405  NA 137  K 3.3*  CL 101  CO2 23  BUN 7  CREATININE 0.50  GLUCOSE 125*  CALCIUM 8.4   No results found for this basename: LABPT, INR,  in the last 72 hours  EXAM General - Patient is Alert, Appropriate and Oriented Extremity - Neurovascular intact Sensation intact distally Dorsiflexion/Plantar flexion intact Dressing - dressing C/D/I Motor Function - intact, moving foot and toes well on exam.  Hemovac pulled without difficulty.  Past Medical History  Diagnosis Date  . PONV (postoperative nausea and vomiting)   . Edema extremities     lower legs and hands /denies hypertension  . Asthma     exercise induced  . Seasonal allergies 9/14    post nasal drip- states has had cold but is imporving with allegra usage- no fever  . GERD (gastroesophageal reflux disease)   . Arthritis   . Anemia   . Cough 08/29/13    x 2 weeks- states is a cold but is  improving    Assessment/Plan: 1 Day Post-Op Procedure(s) (LRB): RIGHT TOTAL KNEE ARTHROPLASTY (Right) Principal Problem:   OA (osteoarthritis) of knee Active Problems:   Postoperative anemia due to acute blood loss   Hypokalemia  Estimated body mass index is 35.73 kg/(m^2) as calculated from the following:   Height as of this encounter: 5\' 1"  (1.549 m).   Weight as of this encounter: 85.73 kg (189 lb). Advance diet Up with therapy Plan for discharge tomorrow Discharge home with home health  DVT Prophylaxis - Xarelto Weight-Bearing as tolerated to right leg No vaccines. D/C O2 and Pulse OX and try on Room [address]  Christina Baker 09/09/2013, 8:46 AM

## 2013-09-09 NOTE — Evaluation (Signed)
Occupational Therapy Evaluation Patient Details Name: Christina Baker MRN: [id number] DOB: 02-25-1951 Today's Date: 09/09/2013 Time: 4098-1191 OT Time Calculation (min): 26 min  OT Assessment / Plan / Recommendation History of present illness 62 yo female s/p R TKA 9/29   Clinical Impression   Pt demos decline in function with ADLs and ADL mobility safety and would benefit form acute OT services to address impairments to help restore PLOF to return home safely    OT Assessment  Patient needs continued OT Services    Follow Up Recommendations  Home health OT;Supervision/Assistance - 24 hour    Barriers to Discharge   None  Equipment Recommendations       Recommendations for Other Services    Frequency  Min 2X/week    Precautions / Restrictions Precautions Precautions: Knee Required Braces or Orthoses: Knee Immobilizer - Right Knee Immobilizer - Right: Discontinue once straight leg raise with < 10 degree lag Restrictions Weight Bearing Restrictions: No RLE Weight Bearing: Weight bearing as tolerated   Pertinent Vitals/Pain 8/10, had pain meds approximately 30 minutes prior to session    ADL  Grooming: Performed;Wash/dry hands;Wash/dry face;Min guard Where Assessed - Grooming: Supported standing Upper Body Bathing: Supervision/safety;Set up;Simulated Lower Body Bathing: Simulated;Moderate assistance Upper Body Dressing: Performed;Set up;Supervision/safety Lower Body Dressing: Maximal assistance Toilet Transfer: Performed;Minimal assistance Toilet Transfer Method: Sit to stand Toilet Transfer Equipment: Raised toilet seat with arms (or 3-in-1 over toilet);Grab bars Toileting - Clothing Manipulation and Hygiene: Performed;Moderate assistance Where Assessed - Toileting Clothing Manipulation and Hygiene: Standing Tub/Shower Transfer Method: Not assessed Equipment Used: Rolling walker;Knee Immobilizer Transfers/Ambulation Related to ADLs: assist to rise, stabilize,  control descent. vcs safety, hand placement    OT Diagnosis: Acute pain  OT Problem List: Decreased knowledge of use of DME or AE;Pain;Impaired balance (sitting and/or standing);Decreased activity tolerance OT Treatment Interventions: Self-care/ADL training;Therapeutic exercise;Patient/family education;Neuromuscular education;Balance training;Therapeutic activities;DME and/or AE instruction   OT Goals(Current goals can be found in the care plan section) Acute Rehab OT Goals Patient Stated Goal: home. regain independence OT Goal Formulation: With patient Time For Goal Achievement: 09/16/13 Potential to Achieve Goals: Good ADL Goals Pt Will Perform Grooming: with set-up;with supervision;standing Pt Will Perform Lower Body Bathing: with min assist;with adaptive equipment Pt Will Perform Lower Body Dressing: with mod assist;with min assist;with adaptive equipment Pt Will Transfer to Toilet: with min guard assist;ambulating;grab bars (3 in 1) Pt Will Perform Toileting - Clothing Manipulation and hygiene: with min assist;sit to/from stand Pt Will Perform Tub/Shower Transfer: with min assist;with min guard assist;shower seat;grab bars  Visit Information  Last OT Received On: 09/09/13 Assistance Needed: +1 History of Present Illness: 62 yo female s/p R TKA 9/29       Prior Functioning     Home Living Family/patient expects to be discharged to:: Private residence Living Arrangements: Spouse/significant other Type of Home: House Home Layout: Two level;Able to live on main level with bedroom/bathroom Alternate Level Stairs-Number of Steps: 2.5 steps-garage Home Equipment: Bedside commode Prior Function Level of Independence: Independent Communication Communication: No difficulties Dominant Hand: Right         Vision/Perception Vision - History Baseline Vision: Wears glasses all the time Patient Visual Report: No change from baseline Perception Perception: Within Functional  Limits   Cognition  Cognition Arousal/Alertness: Awake/alert Behavior During Therapy: WFL for tasks assessed/performed Overall Cognitive Status: Within Functional Limits for tasks assessed    Extremity/Trunk Assessment Upper Extremity Assessment Upper Extremity Assessment: Overall WFL for tasks assessed Lower Extremity Assessment Lower  Extremity Assessment: Defer to PT evaluation Cervical / Trunk Assessment Cervical / Trunk Assessment: Normal     Mobility Bed Mobility Bed Mobility: Not assessed Supine to Sit: 4: Min assist Details for Bed Mobility Assistance: Pt up in recliner Transfers Sit to Stand: 4: Min assist;From toilet;From chair/3-in-1 Stand to Sit: 4: Min assist;To chair/3-in-1;With armrests;To toilet Details for Transfer Assistance: assist to rise, stabilize, control descent. vcs safety, hand placement     Exercise     Balance Balance Balance Assessed: Yes Dynamic Standing Balance Dynamic Standing - Balance Support: During functional activity Dynamic Standing - Level of Assistance: 4: Min assist   End of Session OT - End of Session Equipment Utilized During Treatment: Right knee immobilizer;Rolling walker;Other (comment) (3 in 1 over toilet) Activity Tolerance: Patient tolerated treatment well Patient left: in chair;with call bell/phone within reach CPM Right Knee CPM Right Knee: Off  GO     Margaretmary Eddy [facility] 09/09/2013, 12:49 PM

## 2013-09-10 LAB — CBC
Hemoglobin: 11.2 g/dL — ABNORMAL LOW (ref 12.0–15.0)
MCH: 31.2 pg (ref 26.0–34.0)
RBC: 3.59 MIL/uL — ABNORMAL LOW (ref 3.87–5.11)
WBC: 13.5 10*3/uL — ABNORMAL HIGH (ref 4.0–10.5)

## 2013-09-10 LAB — BASIC METABOLIC PANEL
CO2: 28 mEq/L (ref 19–32)
Calcium: 9.1 mg/dL (ref 8.4–10.5)
Chloride: 100 mEq/L (ref 96–112)
GFR calc Af Amer: >90 mL/min (ref >90)
Glucose, Bld: 136 mg/dL — ABNORMAL HIGH (ref 70–99)
Sodium: 138 mEq/L (ref 135–145)

## 2013-09-10 MED ORDER — TRAMADOL HCL 50 MG PO TABS: 50.0000 mg | ORAL_TABLET | Freq: Four times a day (QID) | ORAL | Status: AC | PRN

## 2013-09-10 MED ORDER — METHOCARBAMOL 500 MG PO TABS: 500.0000 mg | ORAL_TABLET | Freq: Four times a day (QID) | ORAL | Status: AC | PRN

## 2013-09-10 MED ORDER — OXYCODONE HCL 5 MG PO TABS: 5.0000 mg | ORAL_TABLET | ORAL | Status: AC | PRN

## 2013-09-10 MED ORDER — POTASSIUM CHLORIDE CRYS ER 20 MEQ PO TBCR
40.0000 meq | EXTENDED_RELEASE_TABLET | Freq: Once | ORAL | Status: AC
  Administered 2013-09-10: 40 meq via ORAL
  Filled 2013-09-10: qty 2

## 2013-09-10 MED ORDER — RIVAROXABAN 10 MG PO TABS: 10.0000 mg | ORAL_TABLET | Freq: Every day | ORAL | Status: AC

## 2013-09-10 NOTE — Progress Notes (Signed)
   Subjective: 2 Days Post-Op Procedure(s) (LRB): RIGHT TOTAL KNEE ARTHROPLASTY (Right) Patient reports pain as mild.   Patient seen in rounds for Dr. Lequita Halt. Patient is well, but has had some minor complaints of pain in the knee, requiring pain medications Patient is ready to go home later today.  Objective: Vital signs in last 24 hours: Temp:  [97.8 F (36.6 C)-99.1 F (37.3 C)] 98.7 F (37.1 C) (10/01 0509) Pulse Rate:  [59-66] 64 (10/01 0509) Resp:  [14-18] 14 (10/01 0509) BP: (103-123)/(61-80) 103/68 mmHg (10/01 0700) SpO2:  [94 %-97 %] 97 % (10/01 0509)  Intake/Output from previous day:  Intake/Output Summary (Last 24 hours) at 09/10/13 0754 Last data filed at 09/09/13 1500  Gross per 24 hour  Intake    640 ml  Output    400 ml  Net    240 ml    Intake/Output this shift:    Labs:  Recent Labs  09/09/13 0405 09/10/13 0400  HGB 11.3* 11.2*    Recent Labs  09/09/13 0405 09/10/13 0400  WBC 13.0* 13.5*  RBC 3.64* 3.59*  HCT 33.3* 33.3*  PLT 211 191    Recent Labs  09/09/13 0405 09/10/13 0400  NA 137 138  K 3.3* 3.3*  CL 101 100  CO2 23 28  BUN 7 10  CREATININE 0.50 0.47*  GLUCOSE 125* 136*  CALCIUM 8.4 9.1   No results found for this basename: LABPT, INR,  in the last 72 hours  EXAM: General - Patient is Alert, Appropriate and Oriented Extremity - Neurovascular intact Sensation intact distally Dorsiflexion/Plantar flexion intact Incision - clean, dry, no drainage, healing Motor Function - intact, moving foot and toes well on exam.   Assessment/Plan: 2 Days Post-Op Procedure(s) (LRB): RIGHT TOTAL KNEE ARTHROPLASTY (Right) Procedure(s) (LRB): RIGHT TOTAL KNEE ARTHROPLASTY (Right) Past Medical History  Diagnosis Date  . PONV (postoperative nausea and vomiting)   . Edema extremities     lower legs and hands /denies hypertension  . Asthma     exercise induced  . Seasonal allergies 9/14    post nasal drip- states has had cold but is  imporving with allegra usage- no fever  . GERD (gastroesophageal reflux disease)   . Arthritis   . Anemia   . Cough 08/29/13    x 2 weeks- states is a cold but is improving   Principal Problem:   OA (osteoarthritis) of knee Active Problems:   Postoperative anemia due to acute blood loss   Hypokalemia  Estimated body mass index is 35.73 kg/(m^2) as calculated from the following:   Height as of this encounter: 5\' 1"  (1.549 m).   Weight as of this encounter: 85.73 kg (189 lb). Up with therapy Discharge home with home health Diet - Regular diet Follow up - in 2 weeks Activity - WBAT Disposition - Home Condition Upon Discharge - Good D/C Meds - See DC Summary DVT Prophylaxis - Xarelto  Chemika Nightengale 09/10/2013, 7:54 AM

## 2013-09-10 NOTE — Progress Notes (Signed)
Physical Therapy Treatment Patient Details Name: Christina Baker MRN: [id number] DOB: [birth year] Today's Date: 09/10/2013 Time: 8295-6213 PT Time Calculation (min): 31 min  PT Assessment / Plan / Recommendation  History of Present Illness 62 yo female s/p R TKA 9/29   PT Comments   Progressing with mobility. Pain a little better today. Plan is for d/c home later today. Will have a 2nd session to practice steps. Recommend HHPT.   Follow Up Recommendations  Home health PT     Does the patient have the potential to tolerate intense rehabilitation     Barriers to Discharge        Equipment Recommendations  Rolling walker with 5" wheels    Recommendations for Other Services OT consult  Frequency 7X/week   Progress towards PT Goals Progress towards PT goals: Progressing toward goals  Plan Current plan remains appropriate    Precautions / Restrictions Precautions Precautions: Knee Required Braces or Orthoses: Knee Immobilizer - Right Knee Immobilizer - Right: Discontinue once straight leg raise with < 10 degree lag Restrictions Weight Bearing Restrictions: No RLE Weight Bearing: Weight bearing as tolerated   Pertinent Vitals/Pain 6/10 R knee with activity. Ice applied end of session    Mobility  Bed Mobility Bed Mobility: Supine to Sit Supine to Sit: 4: Min assist Details for Bed Mobility Assistance: assist for R Le.  Transfers Transfers: Sit to Stand;Stand to Sit Sit to Stand: 4: Min guard;From toilet;From bed Stand to Sit: 4: Min guard;To toilet;To bed Details for Transfer Assistance:  vcs safety, hand placement Ambulation/Gait Ambulation/Gait Assistance: 4: Min guard Ambulation Distance (Feet): 100 Feet Assistive device: Rolling walker Ambulation/Gait Assistance Details: VCs safety, sequence Gait Pattern: Step-to pattern;Decreased stride length;Antalgic;Decreased step length - right    Exercises Total Joint Exercises Ankle Circles/Pumps: AROM;Both;15  reps;Supine Quad Sets: AROM;Both;10 reps;Supine Heel Slides: AAROM;Right;10 reps;Supine Hip ABduction/ADduction: AAROM;Right;10 reps;Supine;AROM Straight Leg Raises: AAROM;Right;10 reps;Supine   PT Diagnosis:    PT Problem List:   PT Treatment Interventions:     PT Goals (current goals can now be found in the care plan section)    Visit Information  Last PT Received On: 09/10/13 Assistance Needed: +1 History of Present Illness: 62 yo female s/p R TKA 9/29    Subjective Data      Cognition  Cognition Arousal/Alertness: Awake/alert Behavior During Therapy: WFL for tasks assessed/performed Overall Cognitive Status: Within Functional Limits for tasks assessed    Balance     End of Session PT - End of Session Activity Tolerance: Patient tolerated treatment well Patient left: in chair;with call bell/phone within reach CPM Right Knee CPM Right Knee: Off   GP     Rebeca Alert, MPT Pager: [phone]

## 2013-09-10 NOTE — Progress Notes (Signed)
[facility]  [facility] is providing the following services: RW and [employer]  If patient discharges after hours, please call [phone].   Renard Hamper 09/10/2013, 10:33 AM

## 2013-09-10 NOTE — Progress Notes (Signed)
Physical Therapy Treatment Patient Details Name: Christina Baker MRN: [id number] DOB: [birth year] Today's Date: 09/10/2013 Time: 4098-1191 PT Time Calculation (min): 22 min  PT Assessment / Plan / Recommendation  History of Present Illness 62 yo female s/p R TKA 9/29   PT Comments   2nd session. Practiced stair negotiation. All education completed. Ready to d/c from PT standpoint.   Follow Up Recommendations  Home health PT     Does the patient have the potential to tolerate intense rehabilitation     Barriers to Discharge        Equipment Recommendations  Rolling walker with 5" wheels    Recommendations for Other Services OT consult  Frequency 7X/week   Progress towards PT Goals Progress towards PT goals: Progressing toward goals  Plan Current plan remains appropriate    Precautions / Restrictions Precautions Precautions: Knee Required Braces or Orthoses: Knee Immobilizer - Right Knee Immobilizer - Right: Discontinue once straight leg raise with < 10 degree lag Restrictions Weight Bearing Restrictions: No RLE Weight Bearing: Weight bearing as tolerated   Pertinent Vitals/Pain 5/10 R knee.  Ice applied end of session.     Mobility  Bed Mobility Bed Mobility: Not assessed Details for Bed Mobility Assistance: pt sitting in recliner Transfers Transfers: Sit to Stand;Stand to Sit Sit to Stand: 4: Min guard;From chair/3-in-1 Stand to Sit: 4: Min guard;To chair/3-in-1 Details for Transfer Assistance:  vcs safety, hand placement Ambulation/Gait Ambulation/Gait Assistance: 4: Min guard Ambulation Distance (Feet): 135 Feet Assistive device: Rolling walker Gait Pattern: Step-to pattern;Antalgic;Decreased stride length Stairs: Yes Stairs Assistance: 4: Min assist Stairs Assistance Details (indicate cue type and reason): VCS safety, technique, sequence. Assist to stabilize walker. Husband present.  Stair Management Technique: Backwards;With walker;Step to pattern Number  of Stairs: 2    Exercises     PT Diagnosis:    PT Problem List:   PT Treatment Interventions:     PT Goals (current goals can now be found in the care plan section)    Visit Information  Last PT Received On: 09/10/13 Assistance Needed: +1 History of Present Illness: 62 yo female s/p R TKA 9/29    Subjective Data      Cognition       Balance     End of Session PT - End of Session Equipment Utilized During Treatment: Gait belt Activity Tolerance: Patient tolerated treatment well Patient left: in chair;with call bell/phone within reach;with family/visitor present   GP     Rebeca Alert, MPT Pager: [phone]

## 2013-09-11 NOTE — Progress Notes (Signed)
Discharge summary sent to payer through [facility]  

## 2013-09-11 NOTE — Discharge Summary (Signed)
Physician Discharge Summary   Patient ID: Christina Baker MRN: [id number] DOB/AGE: 1951/10/04 62 y.o.  Admit date: 09/08/2013 Discharge date: 09/10/2013  Primary Diagnosis:  Osteoarthritis Right knee(s)  Admission Diagnoses:  Past Medical History  Diagnosis Date  . PONV (postoperative nausea and vomiting)   . Edema extremities     lower legs and hands /denies hypertension  . Asthma     exercise induced  . Seasonal allergies 9/14    post nasal drip- states has had cold but is imporving with allegra usage- no fever  . GERD (gastroesophageal reflux disease)   . Arthritis   . Anemia   . Cough 08/29/13    x 2 weeks- states is a cold but is improving   Discharge Diagnoses:   Principal Problem:   OA (osteoarthritis) of knee Active Problems:   Postoperative anemia due to acute blood loss   Hypokalemia  Estimated body mass index is 35.73 kg/(m^2) as calculated from the following:   Height as of this encounter: 5\' 1"  (1.549 m).   Weight as of this encounter: 85.73 kg (189 lb).  Procedure:  Procedure(s) (LRB): RIGHT TOTAL KNEE ARTHROPLASTY (Right)   Consults: None  HPI: Christina Baker is a 62 y.o. year old female with end stage OA of her right knee with progressively worsening pain and dysfunction. She has constant pain, with activity and at rest and significant functional deficits with difficulties even with ADLs. She has had extensive non-op management including analgesics, injections of cortisone and viscosupplements, and home exercise program, but remains in significant pain with significant dysfunction.Radiographs show bone on bone arthritis medial and patellofemoral. She presents now for right Total Knee Arthroplasty.   Laboratory Data: Admission on 09/08/2013, Discharged on 09/10/2013  Component Date Value Range Status  . ABO/RH(D) 09/08/2013 B NEG   Final  . Antibody Screen 09/08/2013 NEG   Final  . Sample Expiration 09/08/2013 09/11/2013   Final  . ABO/RH(D)  09/08/2013 B NEG   Final  . WBC 09/09/2013 13.0* 4.0 - 10.5 K/uL Final  . RBC 09/09/2013 3.64* 3.87 - 5.11 MIL/uL Final  . Hemoglobin 09/09/2013 11.3* 12.0 - 15.0 g/dL Final  . HCT 40/98/1191 33.3* 36.0 - 46.0 % Final  . MCV 09/09/2013 91.5  78.0 - 100.0 fL Final  . MCH 09/09/2013 31.0  26.0 - 34.0 pg Final  . MCHC 09/09/2013 33.9  30.0 - 36.0 g/dL Final  . RDW 47/82/9562 12.4  11.5 - 15.5 % Final  . Platelets 09/09/2013 211  150 - 400 K/uL Final  . Sodium 09/09/2013 137  135 - 145 mEq/L Final  . Potassium 09/09/2013 3.3* 3.5 - 5.1 mEq/L Final  . Chloride 09/09/2013 101  96 - 112 mEq/L Final  . CO2 09/09/2013 23  19 - 32 mEq/L Final  . Glucose, Bld 09/09/2013 125* 70 - 99 mg/dL Final  . BUN 13/07/6577 7  6 - 23 mg/dL Final  . Creatinine, Ser 09/09/2013 0.50  0.50 - 1.10 mg/dL Final  . Calcium 46/96/2952 8.4  8.4 - 10.5 mg/dL Final  . GFR calc non Af Amer 09/09/2013 >90  >90 mL/min Final  . GFR calc Af Amer 09/09/2013 >90  >90 mL/min Final   Comment: (NOTE)                          The eGFR has been calculated using the CKD EPI equation.  This calculation has not been validated in all clinical situations.                          eGFR's persistently <90 mL/min signify possible Chronic Kidney                          Disease.  . WBC 09/10/2013 13.5* 4.0 - 10.5 K/uL Final  . RBC 09/10/2013 3.59* 3.87 - 5.11 MIL/uL Final  . Hemoglobin 09/10/2013 11.2* 12.0 - 15.0 g/dL Final  . HCT 62/95/2841 33.3* 36.0 - 46.0 % Final  . MCV 09/10/2013 92.8  78.0 - 100.0 fL Final  . MCH 09/10/2013 31.2  26.0 - 34.0 pg Final  . MCHC 09/10/2013 33.6  30.0 - 36.0 g/dL Final  . RDW 32/44/0102 12.8  11.5 - 15.5 % Final  . Platelets 09/10/2013 191  150 - 400 K/uL Final  . Sodium 09/10/2013 138  135 - 145 mEq/L Final  . Potassium 09/10/2013 3.3* 3.5 - 5.1 mEq/L Final  . Chloride 09/10/2013 100  96 - 112 mEq/L Final  . CO2 09/10/2013 28  19 - 32 mEq/L Final  . Glucose, Bld 09/10/2013  136* 70 - 99 mg/dL Final  . BUN 72/53/6644 10  6 - 23 mg/dL Final  . Creatinine, Ser 09/10/2013 0.47* 0.50 - 1.10 mg/dL Final  . Calcium 03/47/4259 9.1  8.4 - 10.5 mg/dL Final  . GFR calc non Af Amer 09/10/2013 >90  >90 mL/min Final  . GFR calc Af Amer 09/10/2013 >90  >90 mL/min Final   Comment: (NOTE)                          The eGFR has been calculated using the CKD EPI equation.                          This calculation has not been validated in all clinical situations.                          eGFR's persistently <90 mL/min signify possible Chronic Kidney                          Disease.  Hospital Outpatient Visit on 08/29/2013  Component Date Value Range Status  . aPTT 08/29/2013 26  24 - 37 seconds Final  . WBC 08/29/2013 8.1  4.0 - 10.5 K/uL Final  . RBC 08/29/2013 4.51  3.87 - 5.11 MIL/uL Final  . Hemoglobin 08/29/2013 14.1  12.0 - 15.0 g/dL Final  . HCT 56/38/7564 41.5  36.0 - 46.0 % Final  . MCV 08/29/2013 92.0  78.0 - 100.0 fL Final  . MCH 08/29/2013 31.3  26.0 - 34.0 pg Final  . MCHC 08/29/2013 34.0  30.0 - 36.0 g/dL Final  . RDW 33/29/5188 12.7  11.5 - 15.5 % Final  . Platelets 08/29/2013 211  150 - 400 K/uL Final  . Sodium 08/29/2013 136  135 - 145 mEq/L Final  . Potassium 08/29/2013 4.0  3.5 - 5.1 mEq/L Final  . Chloride 08/29/2013 98  96 - 112 mEq/L Final  . CO2 08/29/2013 26  19 - 32 mEq/L Final  . Glucose, Bld 08/29/2013 84  70 - 99 mg/dL Final  . BUN 41/66/0630 16  6 - 23 mg/dL Final  .  Creatinine, Ser 08/29/2013 0.51  0.50 - 1.10 mg/dL Final  . Calcium 04/54/0981 9.9  8.4 - 10.5 mg/dL Final  . Total Protein 08/29/2013 7.4  6.0 - 8.3 g/dL Final  . Albumin 19/14/7829 3.8  3.5 - 5.2 g/dL Final  . AST 56/21/3086 27  0 - 37 U/L Final  . ALT 08/29/2013 27  0 - 35 U/L Final  . Alkaline Phosphatase 08/29/2013 86  39 - 117 U/L Final  . Total Bilirubin 08/29/2013 0.5  0.3 - 1.2 mg/dL Final  . GFR calc non Af Amer 08/29/2013 >90  >90 mL/min Final  . GFR calc Af Amer  08/29/2013 >90  >90 mL/min Final   Comment: (NOTE)                          The eGFR has been calculated using the CKD EPI equation.                          This calculation has not been validated in all clinical situations.                          eGFR's persistently <90 mL/min signify possible Chronic Kidney                          Disease.  Marland Kitchen Prothrombin Time 08/29/2013 12.4  11.6 - 15.2 seconds Final  . INR 08/29/2013 0.94  0.00 - 1.49 Final  . Color, Urine 08/29/2013 YELLOW  YELLOW Final  . APPearance 08/29/2013 CLEAR  CLEAR Final  . Specific Gravity, Urine 08/29/2013 1.008  1.005 - 1.030 Final  . pH 08/29/2013 7.5  5.0 - 8.0 Final  . Glucose, UA 08/29/2013 NEGATIVE  NEGATIVE mg/dL Final  . Hgb urine dipstick 08/29/2013 NEGATIVE  NEGATIVE Final  . Bilirubin Urine 08/29/2013 NEGATIVE  NEGATIVE Final  . Ketones, ur 08/29/2013 NEGATIVE  NEGATIVE mg/dL Final  . Protein, ur 57/84/6962 NEGATIVE  NEGATIVE mg/dL Final  . Urobilinogen, UA 08/29/2013 0.2  0.0 - 1.0 mg/dL Final  . Nitrite 95/28/4132 NEGATIVE  NEGATIVE Final  . Leukocytes, UA 08/29/2013 NEGATIVE  NEGATIVE Final   MICROSCOPIC NOT DONE ON URINES WITH NEGATIVE PROTEIN, BLOOD, LEUKOCYTES, NITRITE, OR GLUCOSE <1000 mg/dL.  Marland Kitchen MRSA, PCR 08/29/2013 NEGATIVE  NEGATIVE Final  . Staphylococcus aureus 08/29/2013 NEGATIVE  NEGATIVE Final   Comment:                                 The Xpert SA Assay (FDA                          approved for NASAL specimens                          in patients over 50 years of age),                          is one component of                          a comprehensive surveillance  program.  Test performance has                          been validated by [facility] for patients greater                          than or equal to 11 year old.                          It is not intended                          to diagnose infection nor to                           guide or monitor treatment.     X-Rays:Dg Chest 2 View  08/29/2013   *RADIOLOGY REPORT*  Clinical Data: Osteoarthritis.  Preoperative respiratory exam. Cough.  CHEST - 2 VIEW  Comparison: 05/05/2004  Findings: The heart size and pulmonary vascularity are normal and the lungs are clear.  No acute osseous abnormality.  IMPRESSION: No acute abnormality.   Original Report Authenticated By: Francene Boyers, M.D.    EKG:No orders found for this or any previous visit.   Hospital Course: Christina Baker is a 62 y.o. who was admitted to [facility]. They were brought to the operating room on 09/08/2013 and underwent Procedure(s): RIGHT TOTAL KNEE ARTHROPLASTY.  Patient tolerated the procedure well and was later transferred to the recovery room and then to the orthopaedic floor for postoperative care.  They were given PO and IV analgesics for pain control following their surgery.  They were given 24 hours of postoperative antibiotics of  Anti-infectives   Start     Dose/Rate Route Frequency Ordered Stop   09/08/13 1600  ceFAZolin (ANCEF) IVPB 1 g/50 mL premix     1 g 100 mL/hr over 30 Minutes Intravenous Every 6 hours 09/08/13 1353 09/08/13 2231   09/08/13 0800  ceFAZolin (ANCEF) IVPB 2 g/50 mL premix     2 g 100 mL/hr over 30 Minutes Intravenous On call to O.R. 09/08/13 9147 09/08/13 1035     and started on DVT prophylaxis in the form of Xarelto.   PT and OT were ordered for total joint protocol.  Discharge planning consulted to help with postop disposition and equipment needs.  Patient had a decent night on the evening of surgery.  They started to get up OOB with therapy on day one. Hemovac drain was pulled without difficulty.  Continued to work with therapy into day two.  Dressing was changed on day two and the incision was healing well.  Patient was seen in rounds and was ready to go home later that day.   Discharge Medications: Prior to Admission medications   Medication Sig  Start Date End Date Taking? Authorizing Provider  dexlansoprazole (DEXILANT) 60 MG capsule Take 60 mg by mouth daily.   Yes Historical Provider, MD  LORazepam (ATIVAN) 0.5 MG tablet Take 0.5 mg by mouth 2 (two) times daily as needed for anxiety.   Yes Historical Provider, MD  triamterene-hydrochlorothiazide (MAXZIDE-25) 37.5-25 MG per tablet Take 1 tablet by mouth daily.  Yes Historical Provider, MD  fexofenadine (ALLEGRA) 30 MG tablet Take 30 mg by mouth 2 (two) times daily.    Historical Provider, MD  methocarbamol (ROBAXIN) 500 MG tablet Take 1 tablet (500 mg total) by mouth every 6 (six) hours as needed. 09/10/13   Alexzandrew Perkins, PA-C  oxyCODONE (OXY IR/ROXICODONE) 5 MG immediate release tablet Take 1-2 tablets (5-10 mg total) by mouth every 3 (three) hours as needed. 09/10/13   Alexzandrew Julien Girt, PA-C  rivaroxaban (XARELTO) 10 MG TABS tablet Take 1 tablet (10 mg total) by mouth daily with breakfast. Take Xarelto for two and a half more weeks, then discontinue Xarelto. Once the patient has completed the blood thinner regimen, then take a Baby 81 mg Aspirin daily for four more weeks. 09/10/13   Alexzandrew Perkins, PA-C  traMADol (ULTRAM) 50 MG tablet Take 1-2 tablets (50-100 mg total) by mouth every 6 (six) hours as needed (mild pain). 09/10/13   Alexzandrew Julien Girt, PA-C   Discharge home with home health  Diet - Regular diet  Follow up - in 2 weeks  Activity - WBAT  Disposition - Home  Condition Upon Discharge - Good  D/C Meds - See DC Summary  DVT Prophylaxis - Xarelto       Discharge Orders   Future Appointments Provider Department Dept Phone   12/30/2013 10:45 AM [facility] 1 THE [facility] [place] MAMMOGRAP[facility] [phone]   Patient should wear two piece clothing and wear no powder or deodorant. Patient should arrive 15 minutes early.   Future Orders Complete By Expires   Call MD / Call 911  As directed    Comments:     If you experience chest pain or shortness of  breath, CALL 911 and be transported to the hospital emergency room.  If you develope a fever above 101 F, pus (white drainage) or increased drainage or redness at the wound, or calf pain, call your surgeon's office.   Change dressing  As directed    Comments:     Change dressing daily with sterile 4 x 4 inch gauze dressing and apply TED hose. Do not submerge the incision under water.   Constipation Prevention  As directed    Comments:     Drink plenty of fluids.  Prune juice may be helpful.  You may use a stool softener, such as Colace (over the counter) 100 mg twice a day.  Use MiraLax (over the counter) for constipation as needed.   Diet general  As directed    Discharge instructions  As directed    Comments:     Pick up stool softner and laxative for home. Do not submerge incision under water. May shower. Continue to use ice for pain and swelling from surgery. Hip precautions.  Total Hip Protocol.  Take Xarelto for two and a half more weeks, then discontinue Xarelto.   Do not put a pillow under the knee. Place it under the heel.  As directed    Do not sit on low chairs, stoools or toilet seats, as it may be difficult to get up from low surfaces  As directed    Driving restrictions  As directed    Comments:     No driving until released by the physician.   Increase activity slowly as tolerated  As directed    Lifting restrictions  As directed    Comments:     No lifting until released by the physician.   Patient may shower  As directed  Comments:     You may shower without a dressing once there is no drainage.  Do not wash over the wound.  If drainage remains, do not shower until drainage stops.   TED hose  As directed    Comments:     Use stockings (TED hose) for 3 weeks on both leg(s).  You may remove them at night for sleeping.   Weight bearing as tolerated  As directed        Medication List    STOP taking these medications       HYDROcodone-acetaminophen 5-325 MG per  tablet  Commonly known as:  NORCO/VICODIN     ibuprofen 200 MG tablet  Commonly known as:  ADVIL,MOTRIN     Vitamin D 2000 UNITS tablet      TAKE these medications       dexlansoprazole 60 MG capsule  Commonly known as:  DEXILANT  Take 60 mg by mouth daily.     fexofenadine 30 MG tablet  Commonly known as:  ALLEGRA  Take 30 mg by mouth 2 (two) times daily.     LORazepam 0.5 MG tablet  Commonly known as:  ATIVAN  Take 0.5 mg by mouth 2 (two) times daily as needed for anxiety.     methocarbamol 500 MG tablet  Commonly known as:  ROBAXIN  Take 1 tablet (500 mg total) by mouth every 6 (six) hours as needed.     oxyCODONE 5 MG immediate release tablet  Commonly known as:  Oxy IR/ROXICODONE  Take 1-2 tablets (5-10 mg total) by mouth every 3 (three) hours as needed.     rivaroxaban 10 MG Tabs tablet  Commonly known as:  XARELTO  - Take 1 tablet (10 mg total) by mouth daily with breakfast. Take Xarelto for two and a half more weeks, then discontinue Xarelto.  - Once the patient has completed the blood thinner regimen, then take a Baby 81 mg Aspirin daily for four more weeks.     traMADol 50 MG tablet  Commonly known as:  ULTRAM  Take 1-2 tablets (50-100 mg total) by mouth every 6 (six) hours as needed (mild pain).     triamterene-hydrochlorothiazide 37.5-25 MG per tablet  Commonly known as:  MAXZIDE-25  Take 1 tablet by mouth daily.       Follow-up Information   Follow up with Loanne Drilling, MD On 09/23/2013.   Specialty:  Orthopedic Surgery   Contact information:   [address] [place] [address] [phone]       Signed: Patrica Duel 09/11/2013, 10:24 AM

## 2013-09-19 ENCOUNTER — Other Ambulatory Visit: Payer: Self-pay | Admitting: Surgical

## 2013-09-19 DIAGNOSIS — Z96651 Presence of right artificial knee joint: Secondary | ICD-10-CM

## 2013-09-20 ENCOUNTER — Ambulatory Visit (HOSPITAL_COMMUNITY)
Admission: RE | Admit: 2013-09-20 | Discharge: 2013-09-20 | Disposition: A | Discharge status: Home / Self Care | Payer: 59 | Source: Ambulatory Visit | Attending: Orthopedic Surgery | Admitting: Orthopedic Surgery

## 2013-09-20 DIAGNOSIS — R609 Edema, unspecified: Secondary | ICD-10-CM

## 2013-09-20 DIAGNOSIS — M7989 Other specified soft tissue disorders: Secondary | ICD-10-CM | POA: Insufficient documentation

## 2013-09-20 DIAGNOSIS — M79609 Pain in unspecified limb: Secondary | ICD-10-CM

## 2013-09-20 DIAGNOSIS — M7120 Synovial cyst of popliteal space [Baker], unspecified knee: Secondary | ICD-10-CM | POA: Insufficient documentation

## 2013-09-20 NOTE — Progress Notes (Addendum)
*  Preliminary Results* Right lower extremity venous duplex completed. Right lower extremity is negative for deep vein thrombosis. 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  Attempted to page Freddie Breech, PA with results at [phone], however there was no call returned. The patient was discharged home and can be reached by phone if necessary.  09/20/2013 11:39 AM  Gertie Fey, RVT, RDCS, RDMS

## 2013-12-30 ENCOUNTER — Ambulatory Visit (HOSPITAL_COMMUNITY)
Admission: RE | Admit: 2013-12-30 | Discharge: 2013-12-30 | Disposition: A | Discharge status: Home / Self Care | Payer: 59 | Source: Ambulatory Visit | Attending: Obstetrics and Gynecology | Admitting: Obstetrics and Gynecology

## 2013-12-30 ENCOUNTER — Other Ambulatory Visit (HOSPITAL_COMMUNITY): Payer: Self-pay | Admitting: Obstetrics and Gynecology

## 2013-12-30 DIAGNOSIS — Z1231 Encounter for screening mammogram for malignant neoplasm of breast: Secondary | ICD-10-CM

## 2014-04-30 ENCOUNTER — Other Ambulatory Visit: Payer: Self-pay | Admitting: Family Medicine

## 2014-04-30 ENCOUNTER — Ambulatory Visit
Admission: RE | Admit: 2014-04-30 | Discharge: 2014-04-30 | Disposition: A | Discharge status: Home / Self Care | Payer: 59 | Source: Ambulatory Visit | Attending: Family Medicine | Admitting: Family Medicine

## 2014-04-30 DIAGNOSIS — J209 Acute bronchitis, unspecified: Secondary | ICD-10-CM

## 2015-02-23 ENCOUNTER — Other Ambulatory Visit (HOSPITAL_COMMUNITY): Payer: Self-pay | Admitting: Obstetrics and Gynecology

## 2015-02-23 DIAGNOSIS — Z1231 Encounter for screening mammogram for malignant neoplasm of breast: Secondary | ICD-10-CM

## 2015-03-15 ENCOUNTER — Ambulatory Visit (HOSPITAL_COMMUNITY)
Admission: RE | Admit: 2015-03-15 | Discharge: 2015-03-15 | Disposition: A | Discharge status: Home / Self Care | Payer: 59 | Source: Ambulatory Visit | Attending: Obstetrics and Gynecology | Admitting: Obstetrics and Gynecology

## 2015-03-15 DIAGNOSIS — Z1231 Encounter for screening mammogram for malignant neoplasm of breast: Secondary | ICD-10-CM | POA: Diagnosis not present

## 2015-05-27 ENCOUNTER — Other Ambulatory Visit: Payer: Self-pay | Admitting: Family Medicine

## 2015-05-27 DIAGNOSIS — M5416 Radiculopathy, lumbar region: Secondary | ICD-10-CM

## 2016-09-12 ENCOUNTER — Other Ambulatory Visit: Payer: Self-pay | Admitting: Obstetrics and Gynecology

## 2016-09-12 DIAGNOSIS — Z1231 Encounter for screening mammogram for malignant neoplasm of breast: Secondary | ICD-10-CM

## 2016-09-21 ENCOUNTER — Ambulatory Visit
Admission: RE | Admit: 2016-09-21 | Discharge: 2016-09-21 | Disposition: A | Discharge status: Home / Self Care | Payer: 59 | Source: Ambulatory Visit | Attending: Obstetrics and Gynecology | Admitting: Obstetrics and Gynecology

## 2016-09-21 DIAGNOSIS — Z1231 Encounter for screening mammogram for malignant neoplasm of breast: Secondary | ICD-10-CM

## 2017-11-28 ENCOUNTER — Other Ambulatory Visit: Payer: Self-pay | Admitting: Obstetrics and Gynecology

## 2017-11-28 DIAGNOSIS — Z1231 Encounter for screening mammogram for malignant neoplasm of breast: Secondary | ICD-10-CM

## 2017-12-26 ENCOUNTER — Ambulatory Visit
Admission: RE | Admit: 2017-12-26 | Discharge: 2017-12-26 | Disposition: A | Discharge status: Home / Self Care | Payer: 59 | Source: Ambulatory Visit | Attending: Obstetrics and Gynecology | Admitting: Obstetrics and Gynecology

## 2017-12-26 DIAGNOSIS — Z1231 Encounter for screening mammogram for malignant neoplasm of breast: Secondary | ICD-10-CM

## 2018-12-25 ENCOUNTER — Other Ambulatory Visit: Payer: Self-pay | Admitting: Obstetrics and Gynecology

## 2018-12-25 DIAGNOSIS — Z1231 Encounter for screening mammogram for malignant neoplasm of breast: Secondary | ICD-10-CM

## 2019-01-28 ENCOUNTER — Ambulatory Visit: Payer: 59

## 2019-01-30 ENCOUNTER — Ambulatory Visit: Payer: 59

## 2019-02-27 ENCOUNTER — Ambulatory Visit: Payer: 59

## 2019-03-26 ENCOUNTER — Ambulatory Visit: Payer: 59

## 2019-05-09 ENCOUNTER — Ambulatory Visit
Admission: RE | Admit: 2019-05-09 | Discharge: 2019-05-09 | Disposition: A | Discharge status: Home / Self Care | Payer: 59 | Source: Ambulatory Visit | Attending: Obstetrics and Gynecology | Admitting: Obstetrics and Gynecology

## 2019-05-09 ENCOUNTER — Other Ambulatory Visit: Payer: Self-pay

## 2019-05-09 DIAGNOSIS — Z1231 Encounter for screening mammogram for malignant neoplasm of breast: Secondary | ICD-10-CM

## 2019-10-29 IMAGING — MG DIGITAL SCREENING BILATERAL MAMMOGRAM WITH CAD
4 series · 4 of 4 positions shown · non-contrast
Comparison: Previous exam(s).

CLINICAL DATA: Screening.

EXAM:
DIGITAL SCREENING BILATERAL MAMMOGRAM WITH CAD

[R MLO]
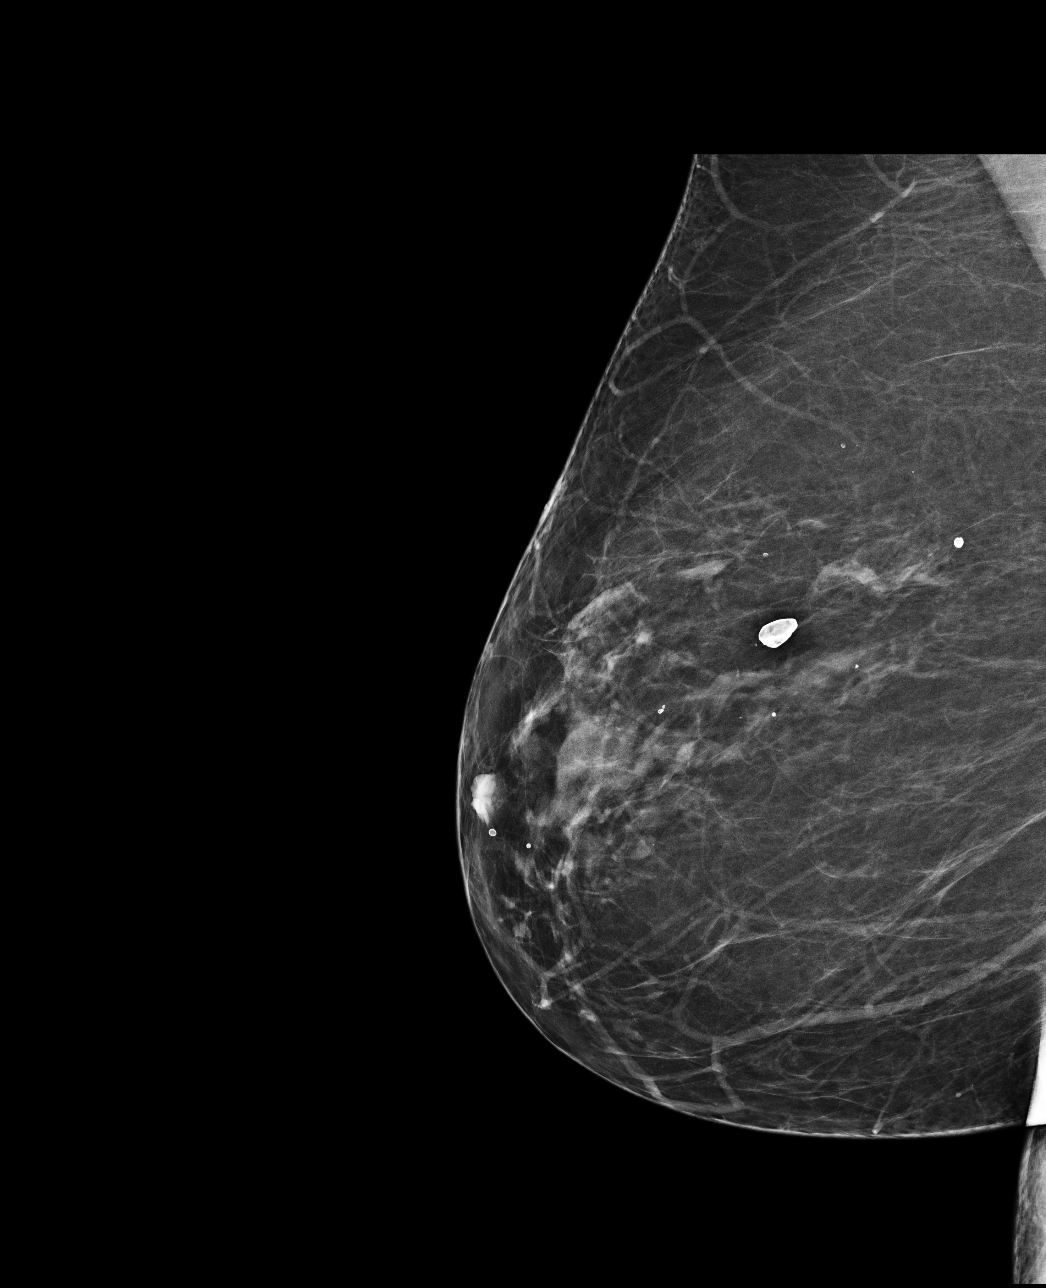

[L CC]
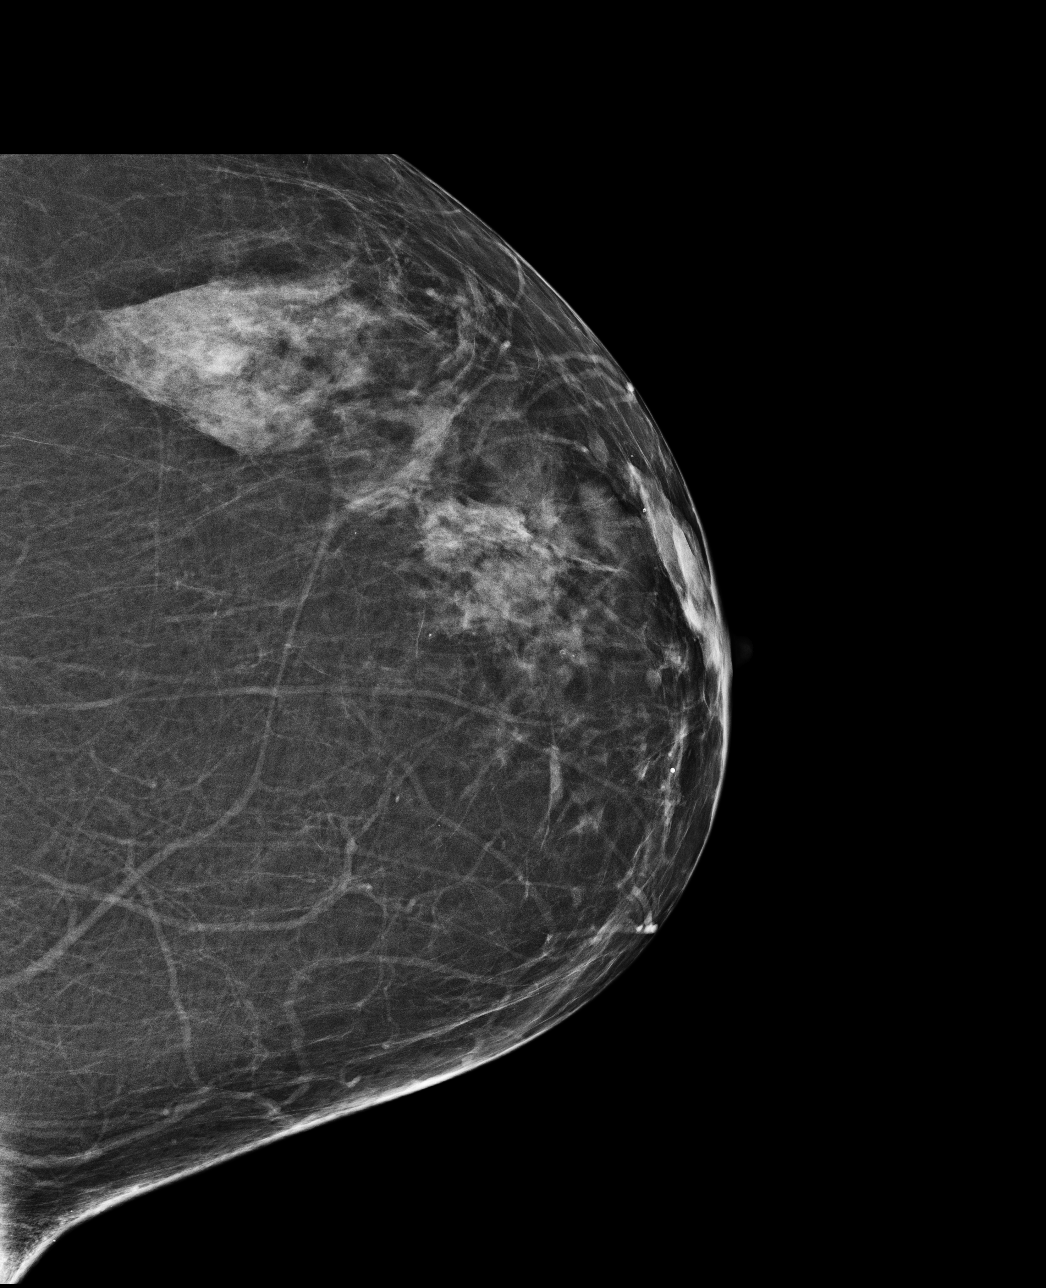

[R CC]
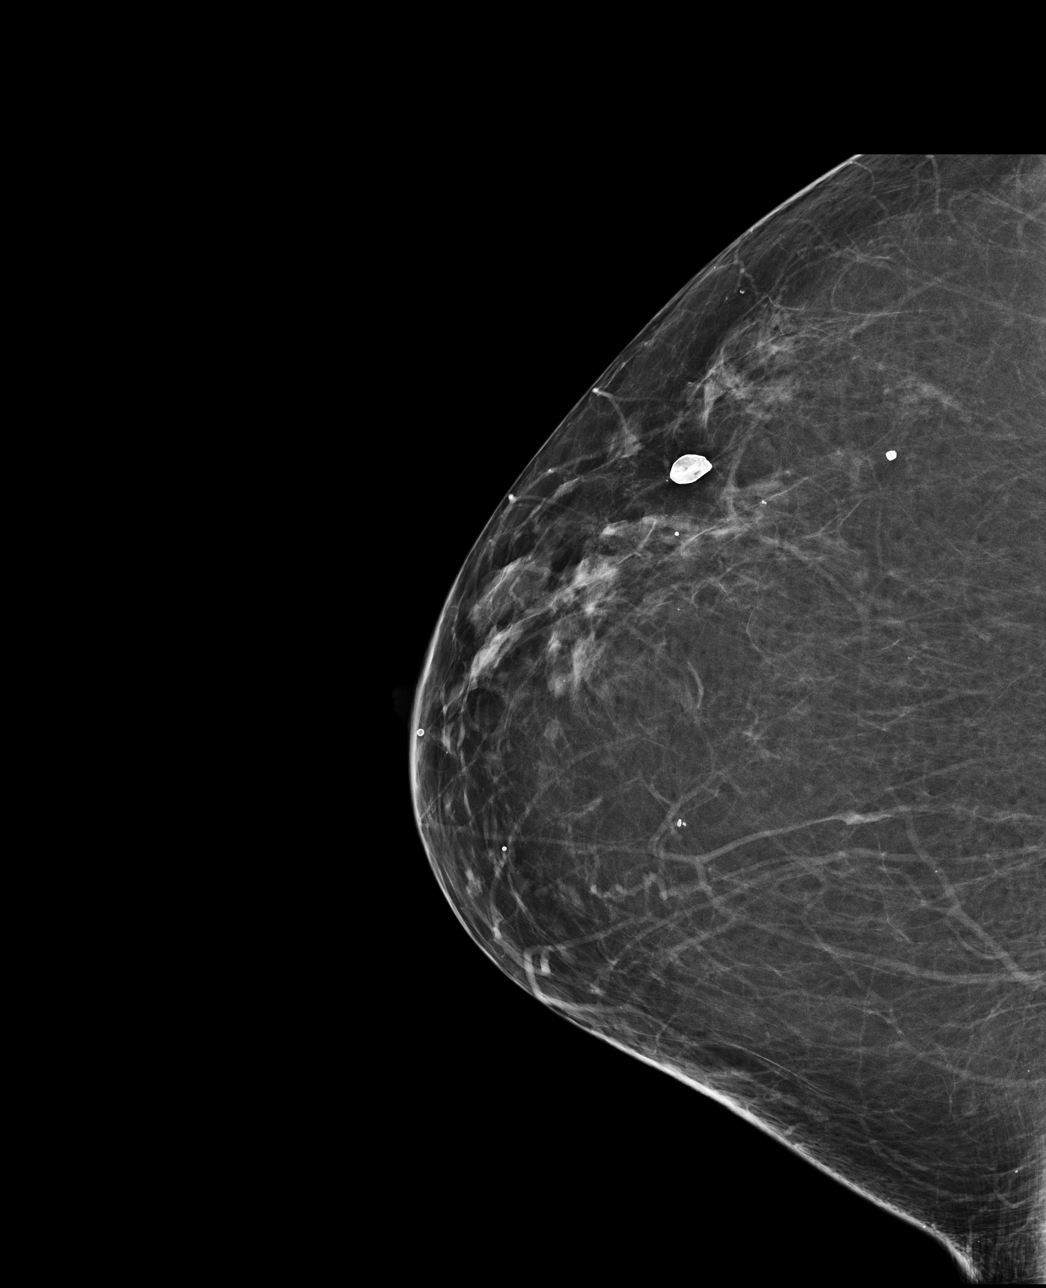

[L MLO]
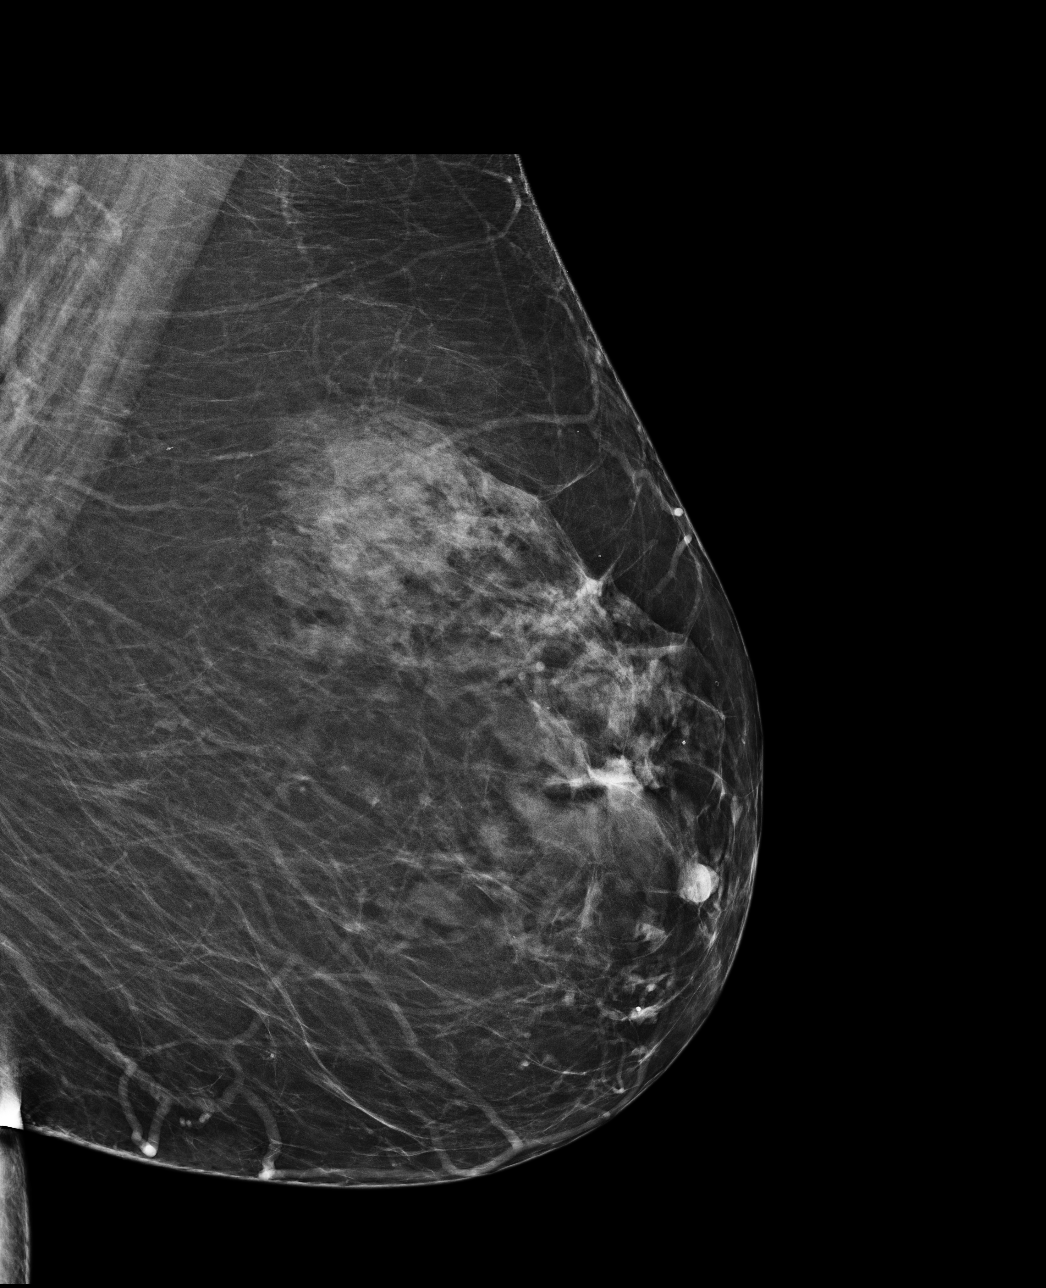

[4 of 4 positions shown; findings below may reference images not displayed]

ACR Breast Density Category b: There are scattered areas of
fibroglandular density.
FINDINGS: There are no findings suspicious for malignancy. Images were
processed with CAD.
IMPRESSION: No mammographic evidence of malignancy. A result letter of this
screening mammogram will be mailed directly to the patient.

RECOMMENDATION:
Screening mammogram in one year. (Code:AS-G-LCT)

BI-RADS CATEGORY  1: Negative.
